# Patient Record
Sex: Male | Born: 1965 | Race: Black or African American | Hispanic: No | Marital: Single | Smoking: Current every day smoker
Health system: Southern US, Community
[De-identification: ages and names within clinical notes are randomized; demographics above are authoritative.]

## PROBLEM LIST (undated history)

## (undated) ENCOUNTER — Telehealth

## (undated) ENCOUNTER — Encounter

## (undated) ENCOUNTER — Encounter: Attending: Psychiatry

## (undated) ENCOUNTER — Telehealth: Attending: Psychiatry

## (undated) DIAGNOSIS — T7840XA Allergy, unspecified, initial encounter: Secondary | ICD-10-CM

## (undated) DIAGNOSIS — F17200 Nicotine dependence, unspecified, uncomplicated: Secondary | ICD-10-CM

## (undated) DIAGNOSIS — F329 Major depressive disorder, single episode, unspecified: Secondary | ICD-10-CM

## (undated) DIAGNOSIS — F32A Depression, unspecified: Secondary | ICD-10-CM

## (undated) DIAGNOSIS — F209 Schizophrenia, unspecified: Secondary | ICD-10-CM

## (undated) HISTORY — DX: Major depressive disorder, single episode, unspecified: F32.9

## (undated) HISTORY — DX: Schizophrenia, unspecified: F20.9

## (undated) HISTORY — DX: Depression, unspecified: F32.A

## (undated) HISTORY — DX: Nicotine dependence, unspecified, uncomplicated: F17.200

## (undated) HISTORY — DX: Allergy, unspecified, initial encounter: T78.40XA

---

## 2000-06-24 ENCOUNTER — Emergency Department (HOSPITAL_COMMUNITY): Admission: EM | Admit: 2000-06-24 | Discharge: 2000-06-24 | Payer: Self-pay

## 2000-12-30 ENCOUNTER — Inpatient Hospital Stay (HOSPITAL_COMMUNITY): Admission: EM | Admit: 2000-12-30 | Discharge: 2000-12-31 | Payer: Self-pay | Admitting: *Deleted

## 2000-12-31 ENCOUNTER — Encounter: Payer: Self-pay | Admitting: Surgery

## 2001-07-10 ENCOUNTER — Inpatient Hospital Stay (HOSPITAL_COMMUNITY): Admission: EM | Admit: 2001-07-10 | Discharge: 2001-07-20 | Payer: Self-pay | Admitting: Psychiatry

## 2001-08-04 ENCOUNTER — Encounter: Admission: RE | Admit: 2001-08-04 | Discharge: 2001-08-04 | Payer: Self-pay | Admitting: *Deleted

## 2001-08-09 ENCOUNTER — Encounter: Admission: RE | Admit: 2001-08-09 | Discharge: 2001-08-09 | Payer: Self-pay | Admitting: *Deleted

## 2001-08-09 ENCOUNTER — Inpatient Hospital Stay (HOSPITAL_COMMUNITY): Admission: EM | Admit: 2001-08-09 | Discharge: 2001-08-20 | Payer: Self-pay | Admitting: Psychiatry

## 2001-08-12 ENCOUNTER — Encounter: Payer: Self-pay | Admitting: Psychiatry

## 2001-09-05 ENCOUNTER — Encounter: Admission: RE | Admit: 2001-09-05 | Discharge: 2001-09-05 | Payer: Self-pay | Admitting: *Deleted

## 2001-09-08 ENCOUNTER — Inpatient Hospital Stay (HOSPITAL_COMMUNITY): Admission: EM | Admit: 2001-09-08 | Discharge: 2001-09-13 | Payer: Self-pay | Admitting: Psychiatry

## 2001-09-21 ENCOUNTER — Encounter: Admission: RE | Admit: 2001-09-21 | Discharge: 2001-09-21 | Payer: Self-pay | Admitting: *Deleted

## 2001-10-29 ENCOUNTER — Inpatient Hospital Stay (HOSPITAL_COMMUNITY): Admission: EM | Admit: 2001-10-29 | Discharge: 2001-10-31 | Payer: Self-pay | Admitting: Psychiatry

## 2001-11-28 ENCOUNTER — Inpatient Hospital Stay (HOSPITAL_COMMUNITY): Admission: EM | Admit: 2001-11-28 | Discharge: 2001-12-02 | Payer: Self-pay | Admitting: Psychiatry

## 2002-01-04 ENCOUNTER — Encounter: Admission: RE | Admit: 2002-01-04 | Discharge: 2002-01-04 | Payer: Self-pay | Admitting: *Deleted

## 2002-02-17 ENCOUNTER — Encounter: Admission: RE | Admit: 2002-02-17 | Discharge: 2002-02-17 | Payer: Self-pay | Admitting: *Deleted

## 2002-04-20 ENCOUNTER — Inpatient Hospital Stay (HOSPITAL_COMMUNITY): Admission: EM | Admit: 2002-04-20 | Discharge: 2002-04-22 | Payer: Self-pay | Admitting: Psychiatry

## 2002-04-21 ENCOUNTER — Encounter: Payer: Self-pay | Admitting: Psychiatry

## 2002-06-19 ENCOUNTER — Encounter: Admission: RE | Admit: 2002-06-19 | Discharge: 2002-06-19 | Payer: Self-pay | Admitting: Psychiatry

## 2002-08-14 ENCOUNTER — Encounter: Admission: RE | Admit: 2002-08-14 | Discharge: 2002-08-14 | Payer: Self-pay | Admitting: *Deleted

## 2002-09-25 ENCOUNTER — Encounter: Admission: RE | Admit: 2002-09-25 | Discharge: 2002-09-25 | Payer: Self-pay | Admitting: *Deleted

## 2002-11-20 ENCOUNTER — Encounter: Admission: RE | Admit: 2002-11-20 | Discharge: 2002-11-20 | Payer: Self-pay | Admitting: *Deleted

## 2002-12-21 ENCOUNTER — Encounter: Admission: RE | Admit: 2002-12-21 | Discharge: 2002-12-21 | Payer: Self-pay | Admitting: Psychiatry

## 2005-09-01 ENCOUNTER — Ambulatory Visit: Payer: Self-pay | Admitting: Internal Medicine

## 2006-03-11 ENCOUNTER — Ambulatory Visit: Payer: Self-pay | Admitting: Internal Medicine

## 2006-12-01 ENCOUNTER — Emergency Department (HOSPITAL_COMMUNITY): Admission: EM | Admit: 2006-12-01 | Discharge: 2006-12-01 | Payer: Self-pay | Admitting: Emergency Medicine

## 2006-12-06 ENCOUNTER — Ambulatory Visit (HOSPITAL_COMMUNITY): Admission: RE | Admit: 2006-12-06 | Discharge: 2006-12-07 | Payer: Self-pay | Admitting: Orthopedic Surgery

## 2007-07-05 ENCOUNTER — Other Ambulatory Visit: Payer: Self-pay | Admitting: Emergency Medicine

## 2007-07-05 ENCOUNTER — Other Ambulatory Visit: Payer: Self-pay

## 2007-07-05 ENCOUNTER — Ambulatory Visit: Payer: Self-pay | Admitting: Psychiatry

## 2007-07-05 ENCOUNTER — Inpatient Hospital Stay (HOSPITAL_COMMUNITY): Admission: EM | Admit: 2007-07-05 | Discharge: 2007-07-07 | Payer: Self-pay | Admitting: Psychiatry

## 2010-02-03 ENCOUNTER — Ambulatory Visit: Payer: Self-pay | Admitting: Internal Medicine

## 2010-02-03 DIAGNOSIS — F17200 Nicotine dependence, unspecified, uncomplicated: Secondary | ICD-10-CM | POA: Insufficient documentation

## 2010-02-03 DIAGNOSIS — J3089 Other allergic rhinitis: Secondary | ICD-10-CM | POA: Insufficient documentation

## 2010-02-07 ENCOUNTER — Encounter: Payer: Self-pay | Admitting: Internal Medicine

## 2010-06-12 NOTE — Miscellaneous (Signed)
Summary: med list update  Clinical Lists Changes  Medications: Added new medication of PROZAC 40 MG CAPS (FLUOXETINE HCL) Take 1 tablet by mouth once a day Added new medication of SEROQUEL 200 MG TABS (QUETIAPINE FUMARATE) 2tabs at bedtime Added new medication of TRAZODONE HCL 50 MG TABS (TRAZODONE HCL) Take 1 tab by mouth at bedtime Added new medication of * PROLIXIX 10MG  Take 1tab  every morning  and 2 tabs at bedtime

## 2010-06-12 NOTE — Assessment & Plan Note (Signed)
Summary: new / medicare /#/cd   Vital Signs:  Patient profile:   45 year old male Height:      66 inches Weight:      199.25 pounds BMI:     32.28 O2 Sat:      98 % on Room air Temp:     98.5 degrees F oral Pulse rate:   90 / minute Pulse rhythm:   regular Resp:     16 per minute BP sitting:   124 / 80  (left arm) Cuff size:   large  Vitals Entered By: Rock Nephew CMA (February 03, 2010 2:54 PM)  Nutrition Counseling: Patient's BMI is greater than 25 and therefore counseled on weight management options.  O2 Flow:  Room air  Primary Care Provider:  Etta Grandchild MD   History of Present Illness: new to me with a complaint of one year hx. of runny nose, nasal congestion, and sneezing.  Preventive Screening-Counseling & Management  Alcohol-Tobacco     Alcohol drinks/day: 0     Smoking Status: current     Smoking Cessation Counseling: yes     Smoke Cessation Stage: precontemplative     Packs/Day: 0.75     Year Started: 1983     Pack years: 20     Tobacco Counseling: to quit use of tobacco products  Caffeine-Diet-Exercise     Does Patient Exercise: no  Hep-HIV-STD-Contraception     Hepatitis Risk: no risk noted     HIV Risk: no risk noted     STD Risk: no risk noted      Sexual History:  not active.        Drug Use:  no.        Blood Transfusions:  no.    Medications Prior to Update: 1)  None  Current Medications (verified): 1)  Allegra Allergy 180 Mg Tabs (Fexofenadine Hcl) .... One By Mouth Once Daily As Needed For Allergies  Allergies (verified): No Known Drug Allergies  Past History:  Past Medical History: Schizophrenia  Past Surgical History: Denies surgical history  Family History: Family History Diabetes 1st degree relative Family History Hypertension Family History of Stroke M 1st degree relative <50  Social History: Single Current Smoker Alcohol use-no Drug use-no Regular exercise-no Smoking Status:  current Packs/Day:   0.75 Hepatitis Risk:  no risk noted HIV Risk:  no risk noted STD Risk:  no risk noted Sexual History:  not active Blood Transfusions:  no Drug Use:  no Does Patient Exercise:  no  Review of Systems  The patient denies anorexia, fever, weight loss, weight gain, chest pain, syncope, dyspnea on exertion, peripheral edema, prolonged cough, headaches, hemoptysis, abdominal pain, hematuria, suspicious skin lesions, enlarged lymph nodes, and angioedema.   ENT:  Complains of nasal congestion and postnasal drainage; denies decreased hearing, difficulty swallowing, ear discharge, hoarseness, nosebleeds, ringing in ears, sinus pressure, and sore throat.  Physical Exam  General:  alert, well-developed, well-nourished, well-hydrated, appropriate dress, cooperative to examination, poor hygiene, and unkempt.   Head:  normocephalic, atraumatic, no abnormalities observed, and no abnormalities palpated.   Eyes:  No corneal or conjunctival inflammation noted. EOMI. Perrla. Funduscopic exam benign, without hemorrhages, exudates or papilledema. Vision grossly normal. Ears:  R ear normal and L ear normal.   Nose:  no external deformity, no external erythema, no airflow obstruction, no intranasal foreign body, no nasal polyps, no nasal mucosal lesions, no mucosal friability, no active bleeding or clots, no sinus percussion tenderness, no  septum abnormalities, no nasal discharge, mucosal pallor, and mucosal edema.   Mouth:  Oral mucosa and oropharynx without lesions or exudates.  Teeth in good repair. Neck:  supple, full ROM, no masses, no thyromegaly, no thyroid nodules or tenderness, no JVD, normal carotid upstroke, no carotid bruits, no cervical lymphadenopathy, and no neck tenderness.   Lungs:  normal respiratory effort, no intercostal retractions, no accessory muscle use, normal breath sounds, no dullness, no fremitus, no crackles, and no wheezes.   Heart:  normal rate, regular rhythm, no murmur, no gallop, no  rub, and no JVD.   Abdomen:  soft, non-tender, normal bowel sounds, no distention, no masses, no guarding, no rigidity, no rebound tenderness, no abdominal hernia, no inguinal hernia, no hepatomegaly, and no splenomegaly.   Msk:  No deformity or scoliosis noted of thoracic or lumbar spine.   Pulses:  R and L carotid,radial,femoral,dorsalis pedis and posterior tibial pulses are full and equal bilaterally Extremities:  No clubbing, cyanosis, edema, or deformity noted with normal full range of motion of all joints.   Neurologic:  No cranial nerve deficits noted. Station and gait are normal. Plantar reflexes are down-going bilaterally. DTRs are symmetrical throughout. Sensory, motor and coordinative functions appear intact. Skin:  turgor normal, color normal, no rashes, no suspicious lesions, no ecchymoses, no petechiae, no purpura, no ulcerations, and no edema.   Cervical Nodes:  no anterior cervical adenopathy and no posterior cervical adenopathy.   Axillary Nodes:  no R axillary adenopathy and no L axillary adenopathy.   Inguinal Nodes:  no R inguinal adenopathy and no L inguinal adenopathy.   Psych:  Cognition and judgment appear intact. Alert and cooperative with normal attention span and concentration. 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   Orders: Tobacco use cessation intermediate 3-10 minutes (99406)  Complete Medication List: 1)  Allegra Allergy 180 Mg Tabs (Fexofenadine hcl) .... One by mouth once daily as needed for allergies  Patient Instructions: 1)  Please schedule a follow-up appointment in 1 month. 2)  Tobacco is  very bad for your health and your loved ones! You Should stop smoking!. 3)  Stop Smoking Tips: Choose a Quit date. Cut down before the Quit date. decide what you will do as a substitute when you feel the urge to smoke(gum,toothpick,exercise). Prescriptions: ALLEGRA ALLERGY 180 MG TABS (FEXOFENADINE HCL) One by mouth once daily as needed for allergies  #14 x 0   Entered and Authorized by:   Etta Grandchild MD   Signed by:   Etta Grandchild MD on 02/03/2010   Method used:   Samples Given   RxID:   [id number]

## 2010-09-23 NOTE — H&P (Signed)
NAME:  Garrett Guerrero, DENNIN NO.:  [id number]   MEDICAL RECORD NO.:  [id number]          PATIENT TYPE:  IPS   LOCATION:  0407                          FACILITY:  [facility]   PHYSICIAN:  Anselm Jungling, MD  DATE OF BIRTH:  [birth year]   DATE OF ADMISSION:  07/05/2007  DATE OF DISCHARGE:                       PSYCHIATRIC ADMISSION ASSESSMENT   A 45 year old male involuntary committed July 05, 2007.   HISTORY OF PRESENT ILLNESS:  The patient presents here on petition with  papers stating that he has a history of schizoaffective disorder.  Presented to the adult clinic feeling stressed for several days, hearing  voices to cut himself and to hurt others.  He is having increased  agitation and decreased sleep.  The patient does report that he has been  hearing voices for about 2-3 days telling him to kill himself or others  by himself, to cut his wrists.  His plan to hurt others would be to hit  them with a baseball bat.  He denies any specific stressors.  He does  report problems with sleep.  His appetite has been satisfactory.  He  reports compliance with his medication and denies any substance use.   PAST PSYCHIATRIC HISTORY:  The patient was here approximately 4 years  ago.  He is an outpatient at [facility].   SOCIAL HISTORY:  He is a 45 year old single male who lives with his  mother who is 28 years of age.  He has no children.  He is on  disability.   FAMILY HISTORY:  None.   ALCOHOL/DRUG HISTORY:  Denies any alcohol or drug use.   PRIMARY CARE Jaislyn Blinn:  Unknown.   MEDICAL PROBLEMS:  He denies any acute or chronic medical problems.   MEDICATIONS:  1. Seroquel 600 at bedtime.  2. Prolixin 10 mg in the morning and 20 mg at bedtime.  3. Prozac 40 mg daily.   DRUG ALLERGIES:  NO KNOWN ALLERGIES.   PHYSICAL EXAMINATION:  GENERAL:  This is a well-nourished male in no  acute distress.  He was assessed at [facility]  where he received Geodon IM.  VITAL SIGNS:  Temperature is 98.4, 84 heart rate, 20 respirations, blood  pressure 127/85.  He is 209 pounds.  He is 6 feet tall.   LABORATORY DATA:  His alcohol level was less than 5.  Urine drug screen  was negative.  CBC within normal limits.  Urinalysis was negative.  His  glucose 124.   MENTAL STATUS EXAM:  This a fully alert, cooperative male, good eye  contact.  He is casually dressed.  His speech is clear, normal pace and  tone.  The patient's mood is neutral.  The patient's affect is somewhat  flat, but pleasant, very polite.  Thought processes:  Endorsing auditory  hallucinations, although he does not appear to be actively responding at  this time and also endorsing suicidal or homicidal thoughts.  Promises  safety to himself and others, cognition intact.  His memory is good.  Judgment is fair.  Insight is fair.   AXIS I:  Schizoaffective disorder.  AXIS II:  Deferred.  AXIS III:  He has no known medical problems.  AXIS IV:  Other psychosocial problems related to his burden of illness.  AXIS V:  Current is 35.   PLAN:  Contract for safety.  Stabilize his mood and thinking.  We will  resume his medications.  Reinforce medication compliance.  Contact  family for any concerns.  Case manager will assess his return to prior  living arrangement.  The patient is to follow with mental health.  His  tentative length stay is 3-5 days.      Landry Corporal, N.P.      Anselm Jungling, MD  Electronically Signed    JO/MEDQ  D:  07/06/2007  T:  07/07/2007  Job:  [id number]

## 2010-09-23 NOTE — Op Note (Signed)
NAMEDORWIN, FITZHENRY                ACCOUNT NO.:  [id number]   MEDICAL RECORD NO.:  [id number]          PATIENT TYPE:  OIB   LOCATION:  5025                         FACILITY:  [facility]   PHYSICIAN:  Madelynn Done, MD  DATE OF BIRTH:  [birth year]   DATE OF PROCEDURE:  12/06/2006  DATE OF DISCHARGE:                               OPERATIVE REPORT   PREOPERATIVE DIAGNOSIS:  1. Right displaced intra-articular distal radius fracture and ulnar      styloid fracture.  2. Tobacco use.  3. Schizophrenia.   POSTOPERATIVE DIAGNOSES:  1. Right displaced intra-articular distal radius fracture and ulnar      styloid fracture.  2. Tobacco use.  3. Schizophrenia.   ATTENDING SURGEON:  Madelynn Done, MD, who was scrubbed and present  for the entire procedure.   ASSISTANT SURGEON:  None.   PROCEDURES:  1. Open treatment of right distal radius intra-articular fracture,      with fixation of three or more fragments  2. Radiographs 3-views right wrist.  3. Stress radiography.   SURGICAL IMPLANTS:  1. [employer] standard distal radius volar locking plate, with 4      smooth pegs distally and 2 partially threaded pegs distally --      total of 6 pegs.  Three 3.5 mm bicortical screws proximally.  2. VITOSS 5 mL bone substitute.   SURGICAL INDICATIONS:  Mr. Woodcox is a 45 year old right-hand-dominant  gentleman who sustained a displaced intra-articular distal radius and  ulna fracture on December 01, 2006.  He fell from a tree.  The patient  presented to my office for follow-up evaluation.  His legal guardian was  present with him.  After a counseling session was carried out after  going through the risks and benefits and alternatives of treatment, the  patient's family agreed to proceed with the above procedure.  Risks  include, but not limited to, bleeding, infection, nerve damage, tendon  injury, artery injury, nonunion, malunion, hardware failure, loss of  motion of the wrist and  digits, and forearm rotation, need for further  surgery and dystrophy of the hand.   A signed informed consent was obtained on the day of surgery.   INTRAOPERATIVE FINDINGS:  The patient did have a comminuted volar  segment.  There was more comminution along the radial column.  After  fixation of the distal radius, the distal radial ulnar joint was  assessed in neutral pronation and supination.  There did not appear to  be any gross instability in all 3 positions, but there appeared to be  more stability with him in supination.  Therefore he was splinted in  supination following the procedure.   DESCRIPTION OF PROCEDURE:  The patient was properly identified in the  preoperative holding area, and a permanent marker was made on the right  wrist indicating correct operative site.  The patient received  preoperative antibiotics prior to any skin incision.  The patient then  brought back to the operating room, placed supine on the anesthesia room  table -- where general anesthesia was administered via LMA.  The patient  tolerated this well.  All pressure points were well-padded.  A well-  padded tourniquet was then placed on the right brachium and sealed with  a 1000 drape.  The right upper extremity was then closed with Betadine  and then prepped with Hibiclens and sterilely draped.  A time-out was  called.  The correct site was identified and the surgical procedure was  then begun.  The right upper extremity skin incision was then marked  directly over the FCR tendon sheath.  It was not carried across the  wrist crease.  The limb was then elevated using Esmarch exsanguination;  the tourniquet insufflated to 250 mmHg.  Using the finger traps and 10  pounds weight,  distraction was then carried out over the end of the  table.  The dissection was carried down through the skin and  subcutaneous tissues.  The standard FCR approach was then used.  Dissection was carried out through the FCR  tendon sheath, and was  released proximally and distally.  Going through the floor of FCR tendon  sheath, the FPL tendon was then identified and retracted ulnarly.  The  pronator of the quadratus was then identified, and an L-shaped capsular  flap was then raised off the pronator from what portion remained.  There  was a moderate amount of the pronator entrapped within the fracture  site.  Portions of the brachial radialis were also then released  distally.  There was a large bone loss within the distal metaphyseal  region and radial side.  After adequate exposure was obtained using the  cortical window using the cortical wounds for windows from the fracture  sites, the 5 mL of bone substitute was then placed within the defect.  This allowed for good stability along the radial column and the dorsal  comminution.  After placement of the allograft, an open reduction was  then performed.  The [employer] distal radius plate was then  applied, by using the oblong hole; a 3.5 cortical screw was then placed  and the position was confirmed using the mini C-arm.  It was felt to be  in good position.   Attention was then turned distally, where the 2 distal locking threaded  pegs were then placed along the lunate corner.  These were appropriately  drilled and measured; did obtain purchase along the dorsal cortical  region of the lunate facet region dorsally.  After placement of the 2  screws, their positions were then confirmed using the C-arm.  Four more  additional locking pegs were then placed.  After the distal fixation was  then obtained and there was proximal fixation, the traction mechanism  was then released.  Then 2 more 3.5 mm bicortical proximal screws were  then placed.   After placement of all the internal fixation, final mini C-arm images  were then obtained -- which did show good reduction and restoration of  the radial height inclination and volar tilt.  There was still a  dorsal  comminution and comminution along the radial column.  Under live  fluoroscopy and stress imaging there did not appear to be any  penetration of the implants within the articular surface; and felt to be  in the subchondral bone region of the distal radius.   After final images were obtained, the wrist was placed through a range  of motion, flexion and extension as well as forearm rotation.  The  distal ulna was then also assessed; there did not appear  to be any  instability or significant displacement of the ulnar styloid and ulnar  head fracture.  I did not feel it  was necessary for it to undergo open  reduction internal fixation.  The wound was then thoroughly irrigated.  The pronator quadratus flap was then closed with 2-0 Vicryl suture.  The  tourniquet was then deflated and hemostasis was obtained with just  direct pressure.  There was not any significant arterial or venous  bleeders.  A small TLS #7 drain was then applied in the deep layer, and  the subcutaneous tissues were closed with 4-0 Monocryl suture.  The skin  was then closed with a running 4-0 nylon horizontal mattress suture, as  well several simple 4-0 nylon sutures.  Then 20 mL of 0.25% Marcaine  were then injected around the field for local anesthesia.  Adaptic was  then applied to the wound, and then a sterile compressive dressing was  applied.  The patient was then placed in a well molded sugar-tong splint  in slight forearm supination.  Again, images were obtained and the  distal radioulnar joint was assessed; there did not appear to be any  significant instability.  It was felt slightly more stable in  supination.  After placement of the splint, the patient was then  extubated and taken to the recovery room in good condition.   INTRAOPERATIVE RADIOGRAPHS:  Three views of the right wrist, and under  live fluoroscopy and stress imaging, these do show restoration of the  radial height and inclination.  There  is the dorsal comminution noted in  slight displacement on the lateral of the dorsal cortex.  The implants  of the pegs and screws appear to be appropriate length.   POSTOPERATIVE PLAN:  The patient will be admitted overnight for IV  antibiotics and pain control.  He will be discharged in the morning if  his pain is controlled.  I will plan to see him back in about 10-14 days  for a splint check, splint change and suture removal.  He will continue  with a long-arm immobilization for 3 weeks.  We will x-ray him at the  first visit and the 3-week mark, then he will come back; then  transitioning image of the splint to allow for some elbow flexion and  extension -- with a total of 6 weeks immobilization.   ANESTHESIA:  General via LMA.   DRAINS:  One #7 TLS drain.      Madelynn Done, MD  Electronically Signed     FWO/MEDQ  D:  12/06/2006  T:  12/06/2006  Job:  [id number]

## 2010-09-23 NOTE — Discharge Summary (Signed)
NAMEJESTIN, BURBACH                ACCOUNT NO.:  [id number]   MEDICAL RECORD NO.:  [id number]          PATIENT TYPE:  OIB   LOCATION:  5025                         FACILITY:  [facility]   PHYSICIAN:  Madelynn Done, MD  DATE OF BIRTH:  11-08-1965   DATE OF ADMISSION:  12/06/2006  DATE OF DISCHARGE:  12/07/2006                               DISCHARGE SUMMARY   ADMITTING DIAGNOSES:  1. Right distal radius and distal ulna fracture.  2. Schizophrenia.   DISCHARGE DIAGNOSES:  1. Right distal radius and distal ulna fracture.  2. Schizophrenia.   PROCEDURES:  Open reduction internal fixation of right distal radius on  December 06, 2006.   HOSPITAL COURSE:  The patient was admitted following the above  procedure.  He tolerated the anesthesia well.  His pain was controlled  on oral pain medications.  He was seen on hospital day number 1.  He was  afebrile.  His vital signs were stable.  He was tolerating a regular  diet.  Patient is ready to be discharged to home.  He was ambulating to  the bathroom.   He was seen and examined.  His splint was clean and drain intact.  His  drain was removed.   DISPOSITION:  To home with his sister.  Patient lives with his mother.   FOLLOWUP:  He is going to be following up in the office in 10 days.  During this he needs to keep his splint clean and dry and do not remove.  He needs to return to clinic sooner if he has worsening fevers, chills,  nausea, vomiting, pain or problems with his splint.      Madelynn Done, MD  Electronically Signed     FWO/MEDQ  D:  12/07/2006  T:  12/07/2006  Job:  [id number]

## 2010-09-26 NOTE — Discharge Summary (Signed)
[facility]  Patient:    Garrett Guerrero, Garrett Guerrero Visit Number: [id number] MRN: [id number]          Service Type: PSY Location: 400 0400 02 Attending Physician:  Jeanice Lim Dictated by:   Jeanice Lim, M.D. Admit Date:  09/08/2001 Discharge Date: 09/13/2001                             Discharge Summary  IDENTIFYING DATA:  This is a 45 year old single African-American male voluntarily admitted for psychosis, reporting auditory hallucinations telling him to hurt himself.  PAST PSYCHIATRIC HISTORY:  The patient has had multiple admissions to [facility] over a short period of time.  Seen now by Dr. Lourdes Sledge in the outpatient clinic.  MEDICATIONS:  Seroquel, Risperdal, Lexapro and Dilantin.  ALLERGIES:  No known drug allergies.  PHYSICAL EXAMINATION:  Essentially within normal limits.  Neurologically nonfocal.  LABORATORY DATA:  Routine admission labs were not indicated due to multiple recent admissions.  MENTAL STATUS EXAMINATION:  Alert, young adult, African-American male casually dressed, cooperative.  Speech within normal limits.  Mood depressed.  Affect flat.  Thought processes goal directed.  Thought content reporting auditory and visual hallucinations as well as suicidal ideation due to command hallucinations, seeing green men.  Question of atypical psychotic symptoms and lack of distress with concern of possible malingering or manipulative report of symptoms.  Cognitively intact.  Judgment and insight limited.  ADMISSION DIAGNOSES: Axis I:    Schizoaffective disorder, rule out. Axis II:   Personality disorder not otherwise specified. Axis III:  None. Axis IV:   Moderate (problems with primary support). Axis V:    30/65.  HOSPITAL COURSE:  The patient was admitted and ordered routine p.r.n. medications and continued on psychotropics.  The patient reported that he felt he was losing it, voices are getting worse, telling him to kill himself  at the time of admission.  After two days in the hospital and optimization of Lexapro and Risperdal, patient reported feeling angry at staff, possibly paranoid, reporting a decrease in auditory hallucinations and resolution of suicidal thoughts, reporting no dangerous ideation.  The patient tolerated medication changes without side effects.  CONDITION ON DISCHARGE:  Improved.  Mood was less dysphoric and distressed. Affect brighter.  Thought process goal directed.  Thought content negative for dangerous ideation.  The patient reported resolution of psychotic symptoms, no longer feeling paranoid nor experiencing hallucinations.  Also denied dangerous ideation, feeling safe and hopeful about outpatient treatment.  DISCHARGE MEDICATIONS: 1. Seroquel 200 mg q.i.d. 2. Valium 10 mg t.i.d. 3. Risperdal 0.5 mg b.i.d. and 2 q.h.s. 4. Lexapro 10 mg q.a.m.  FOLLOW-UP:  Dr. Lourdes Sledge on Wednesday, Sep 21, 2001 at 2 p.m.  DISCHARGE DIAGNOSES: Axis I:    Schizoaffective disorder, rule out. Axis II:   Personality disorder not otherwise specified. Axis III:  None. Axis IV:   Moderate (problems with primary support). Axis V:    Global Assessment of Functioning on discharge 55. Dictated by:   Jeanice Lim, M.D. Attending Physician:  Jeanice Lim DD:  10/19/01 TD:  10/23/01 Job: [id number]

## 2010-09-26 NOTE — Discharge Summary (Signed)
[place]  Patient:    Garrett, Guerrero Visit Number: [id number] MRN: [id number]          Service Type: MED Location: [phone] Attending Physician:  Trauma, Md Dictated by:   Shawn Rayburn, P.A. Admit Date:  12/30/2000 Discharge Date: 12/31/2000                             Discharge Summary  DISCHARGE DIAGNOSES: 1. Status post fall. 2. Left-sided rib fractures with small pleural effusion. 3. Grade 1 spleen laceration.  ADMITTING TRAUMA SURGEON:  Dr. Magnus Ivan.  CONSULTANTS:  None.  PROCEDURES:  None.  HISTORY OF PRESENT ILLNESS:  This is a 45 year old male who fell down approximately 4-5 steps landing on his back and left side about 5 days prior to his presentation to the emergency room on December 30, 2000.  He reports that he developed progressive pain in the lateral chest and flank as well as difficulty breathing.  On exam he was tender about the left lateral rib border and was febrile with a temperature of 100.0.  He was mildly tachycardic with a pulse of 104, respirations of 20, but oxygen saturations adequate at 99% on room air.  Blood pressures was 163/90.  The patient is admitted for overnight observation and for pain management. The patient was placed on Tylox and Toradol on a scheduled basis.  By the following a.m. his symptoms were improved and he reported adequate pain relief.  He was discharged home in stable and improved condition on December 31, 2000.  He was to follow up in the trauma service next week.  DISCHARGE MEDICATIONS: 1. Lopressor 50 mg p.o. q.d. 2. Ativan 1 mg p.o. b.i.d. 3. Tylox 1-2 p.o. q.4-6h. p.r.n. pain #40 no refill. Dictated by:   Shawn Rayburn, P.A. Attending Physician:  Trauma, Md DD:  01/25/01 TD:  01/25/01 Job: [id number]

## 2010-09-26 NOTE — Discharge Summary (Signed)
[facility]  Patient:    DANE, KOPKE Visit Number: [id number] MRN: [id number]          Service Type: PSY Location: 400 0400 02 Attending Physician:  Jeanice Lim Dictated by:   Jeanice Lim, M.D. Admit Date:  09/08/2001 Discharge Date: 09/13/2001                             Discharge Summary  IDENTIFYING DATA:  This is a 45 year old single African-American male voluntarily admitted for psychosis, presenting with a history of psychosis. Recently admitted.  Complaining of visual hallucinations, seeing little green men, continuing to be depressed, reporting decreased sleep, nightmares, and extreme panic.  Previously hospitalized two weeks earlier.  The patient reported compliance with medications and outpatient appointments.  ADMISSION MEDICATIONS: 1. Valium. 2. Zyprexa. 3. Lexapro. 4. Risperdal.  ALLERGIES:  No known drug allergies.  PHYSICAL EXAMINATION:  GENERAL:  Performed at [facility]; within normal limits.  NEUROLOGIC:  Nonfocal.  ROUTINE ADMISSION LABORATORY DATA:  Within normal limits.  MENTAL STATUS EXAMINATION:  Alert, young middle-aged African-American male, casually dressed.  Speech: Within normal limits.  Mood: Depressed.  Affect: Flat.  Thought process: Goal directed.  Thought content: Reporting positive visual and auditory hallucinations, no dangerous ideation except for fleeting suicidal ideation, contracting for safety.  Cognitive: Intact.  Judgment and insight: Limited.  ADMITTING DIAGNOSES: Axis I:    1. Psychosis, not otherwise specified.            2. Rule out schizoaffective disorder, depressed type. Axis II:   None. Axis III:  Hypertension. Axis IV:   Moderate problems with primary support group. Axis V:    25/60  HOSPITAL COURSE:  The patient was admitted and ordered routine p.r.n. medications, was restarted on Valium, Zyprexa, Lexapro, and Risperdal. Zyprexa was discontinued due to lack of  efficacy and Seroquel optimized as well as Risperdal optimized.  Head CT was ordered to rule out any neurologic abnormality and Seroquel was further optimized.  Risperdal tapered due to mild side effects.  Lexapro was optimized, targeting depressive symptoms.  The patient was started on hydrochlorothiazide for elevated blood pressure and Seroquel was optimized at 800 mg q.h.s.  The patient tolerated medication changes well and reported improvement in symptoms.  CONDITION AT DISCHARGE:  Mood: More euthymic.  Affect: Less restricted. Thought process: Goal directed.  Thought content: Negative for acute dangerous ideation or overt psychotic symptoms at the time of discharge.  DISCHARGE MEDICATIONS: 1. Seroquel 200 mg four q.h.s. 2. Valium 10 mg q.i.d. 3. Risperdal 0.5 mg q.a.m., 3 p.m., and two q.h.s. 4. Lexapro 10 mg three q.a.m. 5. Cogentin 0.5 mg q.a.m., 3 p.m., and q.h.s. 6. Hydrochlorothiazide 12.5 mg q.a.m.  FOLLOWUP:  Dr. Lourdes Sledge.  DISCHARGE DIAGNOSES: Axis I:    1. Psychosis, not otherwise specified.            2. Rule out schizoaffective disorder, depressed type. Axis II:   None. Axis III:  Hypertension. Axis IV:   Moderate problems with primary support group. Axis V:    Global assessment of functioning on discharge was 50. Dictated by:   Jeanice Lim, M.D. Attending Physician:  Jeanice Lim DD:  09/21/01 TD:  09/23/01 Job: [id number]

## 2010-09-26 NOTE — H&P (Signed)
[facility]  Patient:    Garrett Guerrero, Garrett Guerrero Visit Number: [id number] MRN: [id number]          Service Type: PSY Location: 400 0405 01 Attending Physician:  Rachael Fee Dictated by:   Candi Leash. Orsini, N.P. Admit Date:  07/10/2001 Discharge Date: 07/20/2001                     Psychiatric Admission Assessment  IDENTIFYING INFORMATION:  A 45 year old single African-American male, involuntarily admitted for psychosis on July 10, 2001.  HISTORY OF PRESENT ILLNESS:  The patient presents with a history of psychosis, seeing "little green men."  He is afraid they are going to hurt him.  He was hiding in the closet crying.  When EMS came to get him, he thought that they were "demons," and that they were there to kill him.  The patient was combative in the emergency room.  He reports that he is stressed over his work.  He was working 12-hour days.  He needs to be home to care for his mother, but states that his boss is not concerned.  His sleeping has been decreased.  He is having a fair appetite.  Reports positive paranoid ideation. He denies any suicidal or homicidal ideation or any current psychosis.  He states that he is doing some drinking on the weekend and reports no abusive of any medication.  PAST PSYCHIATRIC HISTORY:  First hospitalization at [facility]. He has no other hospitalizations, no outpatient treatment, no suicide attempts.  SOCIAL HISTORY:  A 45 year old single African-American male with no children. He lives with his mother.  He works at a [employer].  No legal problems. He has completed the 8th grade.  FAMILY HISTORY:  None.  ALCOHOL DRUG HISTORY:  The patient smokes, has been drinking 2 quarts of beer on the week, last drink on July 10, 2001.  No history of blackouts or seizures.  Denies any substance abuse.  PAST MEDICAL HISTORY:  Primary care Thurmond Hildebran is Dr. Donia Guiles in [place].  Medical problems at  hypertension.  Medications are Valium t.i.d. prescribed by Dr. Shana Chute.  Has been on that for 3-4 years.  DRUG ALLERGIES:  No known allergies.  PHYSICAL EXAMINATION:  Performed at [facility] Emergency Department.  CBC was within normal limits.  CMET was 133, SGOT was 41, urine drug screen was positive for benzos.  Alcohol level was 59.  Urinalysis was within normal limits.  MENTAL STATUS EXAMINATION:  He is an alert, young middle-aged, well-nourished African-American male, cooperative, fair eye contact.  Speech was soft spoken and relevant.  Mood is depressed, affect is flat.  Thought process positive paranoia, no auditory or visual hallucinations, no suicidal or homicidal ideation.  His thought processes otherwise are logical and coherent. Cognitive function:  He is unsure of his age, memory is fair, judgment is fair, insight is fair.  He appears to have limited intellectual functioning.  ADMISSION DIAGNOSES: Axis I:    1. Psychosis not otherwise specified.            2. Rule out major depression with psychotic features.            3. Rule out schizophrenia. Axis II:   Deferred. Axis III:  Hypertension. Axis IV:   Problems with primary support group, occupation. Axis V:    Current 25, estimated this past year 65-70.  INITIAL PLAN OF CARE:  Involuntary admission for psychosis.  Contract for safety.  Patient  placed on 400 hall for close monitoring.  Will initiate Zyprexa for hallucinations, will obtain labs.  Librium will be available for withdrawal symptoms.  Will contact patients pharmacy for blood pressure medication.  Our goal is to stabilize mood and thinking so patient can be safe, to be medication compliant, to follow up with mental health.  TENTATIVE LENGTH OF STAY:  3 to 5 days. Dictated by:   Candi Leash. Orsini, N.P. Attending Physician:  Rachael Fee DD:  08/10/01 TD:  08/10/01 Job: [id number]

## 2010-09-26 NOTE — Discharge Summary (Signed)
NAME:  Garrett Guerrero, Garrett Guerrero                          ACCOUNT NO.:  [id number]   MEDICAL RECORD NO.:  [id number]                   PATIENT TYPE:  IPS   LOCATION:  0400                                 [facility]ILITY[facility]   PHYSICIAN:  Jeanice Lim, MD                DATE OF BIRTH:  17-Feb-1966   DATE OF ADMISSION:  11/28/2001  DATE OF DISCHARGE:  12/02/2001                                 DISCHARGE SUMMARY   IDENTIFYING DATA:  This is a 45 year old African-American male with a  history of schizoaffective disorder became anxious over the last several  weeks due to changes in psychiatric appointments and complications in short-  term disability.  The patient reported having suicidal thoughts when he came  to the hospital as well as auditory hallucinations, although he appeared  somewhat unreliable.   MEDICATIONS:  Lexapro 10 mg q.a.m., Valium 10 mg q.i.d., Seroquel and  Risperdal.   ALLERGIES:  No known drug allergies.   PHYSICAL EXAMINATION:  Essentially within normal limits.  Neurologically  nonfocal.   LABORATORY DATA:  Routine admission labs essentially within normal limits  with glucose mildly elevated at 126.   MENTAL STATUS EXAM:  Large-built male muscular, blunted affect with  psychomotor slowing.  Polite and cooperative.  Mood depressed and mildly  anxious.  Thought processes goal directed.  Thought content negative for  suicidal or homicidal ideation.  The patient reported auditory  hallucinations and was preoccupied with disability, paperwork and concerned  about plans were stressors.  Cognition intact.  Judgment and insight  limited.   ADMISSION DIAGNOSES:   AXIS I:  1. Schizoaffective disorder, depressed.  2. Alcohol abuse in partial remission.   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   AXIS III:  Hypertension.   AXIS IV:  Moderate (problems with financial stress).   AXIS V:  35/62.   HOSPITAL COURSE:  The patient was admitted and ordered routine  p.r.n.  medications.  Was resumed on his psychotropics in addition to  hydrochlorothiazide to manage blood pressure.  Lexapro and Seroquel were  adjusted.  The patient reported a positive response to medication changes  and had great relief when paperwork was completed.   CONDITION ON DISCHARGE:  Improved.  Mood was more euthymic.  Affect  brighter.  Thought processes goal directed.  Thought content negative for  dangerous ideation or psychotic symptoms.  The patient reported motivation  to be compliant with the follow-up plan.   DISCHARGE MEDICATIONS:  1. Seroquel 200 mg t.i.d. and 2 q.h.s.  2. Risperdal 0.5 mg q.a.m., 3 p.m. and 2 q.h.s.  3. Valium 10 mg q.i.d.  4. Cogentin 0.5 mg t.i.d.  5. Hydrochlorothiazide 25 mg, 1/2 q.a.m.  6. Lexapro 10 mg q.a.m.   FOLLOW UP:  Dr. Hipolito Bayley, outpatient clinic, December 30, 2001 at 1  p.m.  Jeanice Lim, MD    JEM/MEDQ  D:  01/04/2002  T:  01/06/2002  Job:  [id number]

## 2010-09-26 NOTE — H&P (Signed)
[facility]  Patient:    Garrett Guerrero, Garrett Guerrero Visit Number: [id number] MRN: [id number]          Service Type: PSY Location: 400 0405 01 Attending Physician:  Rachael Fee Dictated by:   Candi Leash. Orsini, N.P. Admit Date:  07/10/2001 Discharge Date: 07/20/2001                     Psychiatric Admission Assessment  IDENTIFYING INFORMATION:  This is a 45 year old single African-American male voluntarily admitted for psychosis on August 09, 2001.  HISTORY OF PRESENT ILLNESS:  The patient presents with a history of psychosis, recently admitted for visual hallucinations, seeing "little green men."  He continued with the depression and psychotic symptoms.  He reports decreased sleep, experiencing nightmares of killing himself.  The patient feels very nervous upon awakening and having agoraphobia.  His appetite has been fair. He denies any specific stressors but does state that he needs short-term disability.  He has been out of work for the past three weeks.  He reports he has been compliant with his medication and is just stressed that he is not better.  PAST PSYCHIATRIC HISTORY:  Last hospitalization was two weeks ago.  This is his second admission to [facility] with no prior suicide attempts.  He was in the IOP program with Dr. Lourdes Sledge.  SOCIAL HISTORY:  He is a 45 year old single African-American male.  He lives with his mother.  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  He is on short-term disability.  He has completed the eighth grade.  FAMILY HISTORY:  None.  ALCOHOL/DRUG HISTORY:  The patient smokes.  He denies any alcohol or substance abuse.  PRIMARY CARE Remigio Mcmillon:  Dr. Donia Guiles in [place].  MEDICAL PROBLEMS:  Hypertension.  MEDICATIONS:  The patient has been on Valium 10 mg q.i.d., Zyprexa 10 mg q.h.s., Lexapro 20 mg q.a.m., Risperdal 0.5 mg t.i.d., 1 mg q.h.s.  DRUG ALLERGIES:  No known allergies.  PHYSICAL  EXAMINATION:  Performed at last admission.  The patient presents as a well-nourished, well-developed, African-American male.  LABORATORY DATA:  CBC within normal limits.  CMET within normal limits.  UA is negative.  MENTAL STATUS EXAMINATION:  He is an alert, young, middle-aged African-American male.  He is casually dressed with fair eye contact.  He is cooperative.  Speech is normal and relevant.  Mood is depressed.  Affect is flat.  Thought processes are positive visual hallucinations, positive auditory hallucinations.  No suicidal or homicidal ideation.  No paranoid ideation. Cognitive function intact.  Memory is fair.  Judgment and insight is fair.  DIAGNOSES: Axis I:    1. Psychosis not otherwise specified.            2. Rule out major depression with psychotic features. Axis II:   Deferred. Axis III:  Hypertension. Axis IV:   Deferred. Axis V:    Current 25; estimated this past year 60.  PLAN:  Voluntary admission to [facility] for psychosis. Contract for safety.  Check every 15 minutes.  The patient to be placed on the 400 Hall for close monitoring.  Will obtain labs.  Resume his medication. Treatment was discussed with Dr. Kathrynn Running.  Will stabilize his mood and thinking so patient can be safe.  We will decrease his Zyprexa, increase his Seroquel.  TENTATIVE LENGTH OF STAY:  Three to five days. Dictated by:   Candi Leash. Orsini, N.P. Attending Physician:  Rachael Fee DD:  08/11/01 TD:  08/11/01 Job: [id number]

## 2010-09-26 NOTE — Discharge Summary (Signed)
[facility]  Patient:    Garrett Guerrero, Garrett Guerrero Visit Number: [id number] MRN: [id number]          Service Type: PSY Location: 400 0401 02 Attending Physician:  Jeanice Lim Dictated by:   Jeanice Lim, M.D. Admit Date:  08/09/2001 Discharge Date: 08/20/2001                             Discharge Summary  IDENTIFYING DATA:  This is a 45 year old single African-American male voluntarily admitted for psychosis reporting to see little green men, afraid that they are going to hurt him, hearing demons telling him to hurt himself and others.  Also described paranoid ideation.  MEDICATIONS:  Valium t.i.d. prescribed by Dr. Shana Chute.  ALLERGIES:  No known drug allergies.  PHYSICAL EXAMINATION:  Essentially within normal limits.  Neurologically nonfocal.  LABORATORY DATA:  Routine admission labs essentially within normal limits. Urine drug screen positive for benzodiazepines.  Alcohol level 59.  MENTAL STATUS EXAMINATION:  Middle-aged African-American male, cooperative, maintaining poor eye contact.  Speech soft.  Mood depressed.  Affect blunted. Thought process goal directed.  Thought content positive for auditory and visual hallucinations, paranoid ideation, reporting seeing little green men, like figurines running around during the day and hearing voices telling him to hurt himself and to hurt others, which he is able to resist at this time. Cognitively intact.  Judgment and insight limited.  ADMISSION DIAGNOSES: Axis I:    Psychosis not otherwise specified versus schizoaffective disorder,            depressed-type. Axis II:   None. Axis III:  Hypertension. Axis IV:   Moderate (problems with primary support). Axis V:    25/65.  HOSPITAL COURSE:  The patient was admitted and ordered routine p.r.n. medications.  Continued to report episodic hallucinations.  Was optimized on Zyprexa, Valium and trazodone to restore sleep.  His depressive symptoms  were targeted as well as psychotic symptoms.  The patient tolerated medication changes well with a very limited response, which was slow.  The patient reported significant decrease in auditory hallucinations and no suicidal ideation and felt that he was able to cope with the episodic voices and be safe as an outpatient and compliant with medications.  CONDITION ON DISCHARGE:  Improved.  Mood was less depressed.  Affect somewhat brighter, less guarded, less paranoid.  Decrease in auditory and visual hallucinations.  Judgment and insight slightly improved.  DISCHARGE MEDICATIONS: 1. Loxapine 25 mg q.a.m. and 3 p.m. and q.h.s. 2. Trazodone 150 mg q.h.s. 3. Zyprexa 20 mg q.h.s. 4. Valium 10 mg q.i.d. 5. Lexapro 20 mg q.a.m.  FOLLOW-UP:  The patient was advised not to drive sedated and to follow up with Dr. Lourdes Sledge on August 04, 2001 at 11 a.m.  DISCHARGE DIAGNOSES: Axis I:    Psychosis not otherwise specified versus schizoaffective disorder,            depressed-type. Axis II:   None. Axis III:  Hypertension. Axis IV:   Moderate (problems with primary support). Axis V:    Global Assessment of Functioning on discharge 45-50. Dictated by:   Jeanice Lim, M.D. Attending Physician:  Jeanice Lim DD:  09/07/01 TD:  09/07/01 Job: [id number]

## 2010-09-26 NOTE — Discharge Summary (Signed)
NAME:  Garrett Guerrero, Garrett Guerrero                          ACCOUNT NO.:  [id number]   MEDICAL RECORD NO.:  [id number]                   PATIENT TYPE:  IPS   LOCATION:  0407                                 [facility]ACILITY:  [facility]   PHYSICIAN:  Geoffery Lyons, M.D.                   DATE OF BIRTH:  21-Dec-1965   DATE OF ADMISSION:  04/20/2002  DATE OF DISCHARGE:  04/22/2002                                 DISCHARGE SUMMARY   CHIEF COMPLAINT AND PRESENT ILLNESS:  This was one of multiple admissions to  [facility] for this 45 year old African-American male,  single.  History of schizoaffective disorder.  Reports poor sleep for three  weeks, saying that he had just not been able to sleep through the night.  Falls asleep at 8 p.m. and wakes at midnight and stays awake all night.  Became panicky when he was riding in the car.  Heard some voices and arrived  to the psychiatric's office where there was some mix-up in the appointment.  He felt very overwhelmed.  He felt that his world was caving in.  He could  not contract for safety.  He was afraid he could cut his wrist or possibly  harm someone, so inpatient treatment was recommended.   PAST PSYCHIATRIC HISTORY:  Dr. Lourdes Sledge at [facility].   ALCOHOL/DRUG HISTORY:  Denies the use or abuse of any substances.   PAST MEDICAL HISTORY:  Hypertension.   MEDICATIONS:  Hydrochlorothiazide 25 mg daily, Valium 10 mg four times a  day, Lexapro 10 mg daily, Cogentin 0.5 mg three times a day, Seroquel 200 mg  twice a day and 600 mg at night, Risperdal 0.5 mg every morning and 3 p.m.  and 1 mg at night.   PHYSICAL EXAMINATION:  Performed and failed to show any acute findings.   MENTAL STATUS EXAM:  Upon admission reveals a tall, large-built male,  overweight.  Blunted affect.  Appears anxious.  Cooperative.  Initially  fearful.  Preoccupied with thoughts they are going to send him away.  Mood  is anxious.  Affect is  broad.  Thoughts deal with the events, the  uncertainty.  Denies and there is no evidence of auditory hallucinations.  Cognition well-preserved.   ADMISSION DIAGNOSES:   AXIS I:  Schizoaffective disorder.   AXIS II:  No diagnosis.   AXIS III:  Hypertension.   AXIS IV:  Moderate.   AXIS V:  Global Assessment of Functioning upon admission 36; highest Global  Assessment of Functioning in the last year 62.   LABORATORY DATA:  CBC was within normal limits.  Blood chemistries with  alkaline phosphatase 132.  Drug screen was negative for substances of abuse.   HOSPITAL COURSE:  He was basically kept on his Valium 10 mg four times a  day, Cogentin 0.5 mg three times a day, Lexapro 10 mg  in the morning,  Seroquel 200 mg twice a day and 600 mg at night, trazodone 50 mg at night,  Risperdal 0.5 mg in the morning and 3 p.m. and 1 mg at night.  As he settled  into the unit and participated in individual, group and milieu therapy and  he was back on medications.  He felt that he just needed some medication  adjustment.  Denied any auditory or visual hallucinations.  Did endorse some  auditory hallucinations but they did not tell him to hurt himself.  Said  that he had to accept that these symptoms are going to be a part of his life  and he had to accept it.  He denied any suicidal ideation, denied any  homicidal ideation, had worked on Pharmacologist, Optician, dispensing.  Was  wanting to be discharged and pursue outpatient follow-up.   DISCHARGE DIAGNOSES:   AXIS I:  Schizoaffective disorder.   AXIS II:  No diagnosis.   AXIS III:  Hypertension by history.   AXIS IV:  Moderate.   AXIS V:  Global Assessment of Functioning upon discharge 50-55.   DISCHARGE MEDICATIONS:  1. Valium 10 mg four times a day.  2. Lexapro 10 mg daily.  3. Seroquel 200 mg twice a day and 600 mg at night.  4. Risperdal 0.5 mg, 1 in the morning, 1 mg at bedtime.   FOLLOW UP:  Dr. Lourdes Sledge.                                                Geoffery Lyons, M.D.    IL/MEDQ  D:  05/24/2002  T:  05/25/2002  Job:  [id number]

## 2010-09-26 NOTE — Discharge Summary (Signed)
NAME:  Garrett Guerrero, Garrett Guerrero NO.:  [id number]   MEDICAL RECORD NO.:  [id number]          PATIENT TYPE:  IPS   LOCATION:  0407                          FACILITY:  [facility]   PHYSICIAN:  Anselm Jungling, MD  DATE OF BIRTH:  Sep 26, 1965   DATE OF ADMISSION:  07/05/2007  DATE OF DISCHARGE:  07/07/2007                               DISCHARGE SUMMARY   IDENTIFYING DATA AND REASON FOR ADMISSION:  This was an inpatient  psychiatric admission for Garrett Guerrero, a 45 year old single African American  male, with a history of disability due to schizoaffective disorder.  He  is a client of [facility].  He was admitted due to  exacerbation of symptoms.  Please refer to the admission note for  further details pertaining to the symptoms, circumstances, and history  that led to his hospitalization.  He was given initial Axis I diagnosis  of schizoaffective disorder.   MEDICAL AND LABORATORY:  The patient was medically and physically  assessed in the emergency department and by the psychiatric nurse  practitioner upon admission to the inpatient service.  He was in  generally good health, without any active or chronic medical problems.   HOSPITAL COURSE:  The patient was admitted to the adult inpatient  psychiatric service.  He presented as a well-nourished, well-developed  male who was alert, fully oriented, calm, and cooperative.  His affect  was somewhat flat.  There were no signs or symptoms of thought disorder  on the surface.  There were no delusional statements.  He stated that he  no longer felt that he needed to be in the inpatient service.  He stated  that prior to admission he had had a marked increase in his auditory  hallucinations, but he got a shot in the emergency department the day  before, and on the day of the initial interview stated that he had no  further auditory hallucinations.  He requested discharge.  We endeavored  to have a brief hospital stay.  We  restarted his usual medications (see  below).  His sister was brought in for a family session prior to his  discharge on the second full hospital day.   AFTERCARE:  The patient was to follow up at the [facility], where  he normally gets his psychiatric care, with an appointment on July 12, 2007.   DISCHARGE MEDICATIONS:  1. Prolixin 10 mg q.a.m. and 20 mg at bedtime.  2. Seroquel 300 mg at bedtime.  3. Prozac 40 mg daily.  4. Trazodone 50 mg at bedtime.   DISCHARGE DIAGNOSES:   AXIS I:  Schizoaffective disorder, not otherwise specified.   AXIS II:  Deferred.   AXIS III:  No acute or chronic illnesses.   AXIS IV:  Stressors severe.   AXIS V:  Global assessment of function 55.      Anselm Jungling, MD  Electronically Signed     SPB/MEDQ  D:  08/11/2007  T:  08/12/2007  Job:  [id number]

## 2010-09-26 NOTE — H&P (Signed)
[facility]  Patient:    Garrett Guerrero, Garrett Guerrero Visit Number: [id number] MRN: [id number]          Service Type: PSY Location: 400 0400 02 Attending Physician:  Jeanice Lim Dictated by:   Candi Leash. Orsini, N.P. Admit Date:  09/08/2001                     Psychiatric Admission Assessment  IDENTIFYING INFORMATION:  This is a 45 year old single African-American male voluntarily admitted for psychosis on Sep 08, 2001.  HISTORY OF PRESENT ILLNESS:  The patient presents with a history of positive auditory hallucinations, voices telling him to hurt himself.  He reports that he was doing fairly well after his last discharge from [facility] but states that he "may have gotten upset over something" and did not elaborate as to what that was.  The patient states he was experiencing some anxiety when he was at [facility] [employer] on Wednesday to apply for disability.  He reports that he has seen little green men, experiencing some positive paranoid ideation, felt that the staff is talking about his multiple admissions here.  He states he is having nightmares.  He is trying to get disability to care for his mother.  He is experiencing current visual hallucinations but denies any suicidal or homicidal ideation at this time.  PAST PSYCHIATRIC HISTORY:  Third visit to [facility].  Was recently admitted approximately one month ago for similar complaints.  Sees Dr. Lourdes Sledge on an outpatient basis.  Last visit was September 05, 2001.  No prior suicide attempt.  SOCIAL HISTORY:  This is a 45 year old single black male with no children.  He lives with his mother.  He is out of work.  He was working at a [employer].  He is applying for disability.  FAMILY HISTORY:  Brother with schizophrenia, who is now deceased.  ALCOHOL/DRUG HISTORY:  He smokes.  He denies any alcohol or substance abuse.  PRIMARY CARE Zyree Traynham:  Dr. Donia Guiles.  MEDICAL  PROBLEMS:  None.  MEDICATIONS:  Seroquel 200 mg q.i.d., Risperdal 0.5 mg t.i.d., Lexapro 5 mg q.d., Valium 10 mg t.i.d.  DRUG ALLERGIES:  No known allergies.  PHYSICAL EXAMINATION:  The patient appears as a well-nourished African-American male without complaints.  He appears in no acute distress. His head is normocephalic.  His skin is warm and dry.  His vital signs are temperature 97.6, pulse 99, respirations 20, blood pressure 132/76.  He is 6 feet tall.  He is 198 pounds.  MENTAL STATUS EXAMINATION:  He is an alert, young, adult, African-American male.  He is casually dressed, cooperative.  Speech is normal and relevant. Mood is depressed and flat.  Thought processes are positive visual hallucinations.  No suicidal or homicidal ideation.  Goal directed.  Cognitive function intact.  Memory is fair.  Judgment and insight is fair.  DIAGNOSES: Axis I:    1. Psychosis not otherwise specified.            2. Rule out schizoaffective disorder.            3. Rule out major depression with psychosis. Axis II:   Deferred. Axis III:  None. Axis IV:   Problems with primary support group, economics and other            psychosocial problems. Axis V:    Current 30; estimated this past year 39.  PLAN:  Voluntary admission for psychosis.  Contract for safety.  Check every 15 minutes.  The patient will be placed on the 400 Hall.  Will resume his routine medications.  Will increase his Lexapro and increase his Risperdal to decrease his depressive symptoms and psychosis so patient can be safe.  To follow up with Dr. Lourdes Sledge and to be medication-compliant.  TENTATIVE LENGTH OF STAY:  Three to five days. Dictated by:   Candi Leash. Orsini, N.P. Attending Physician:  Jeanice Lim DD:  09/09/01 TD:  09/11/01 Job: [id number]

## 2010-09-26 NOTE — H&P (Signed)
[facility]  Patient:    Garrett Guerrero, Garrett Guerrero Visit Number: [id number] MRN: [id number]          Service Type: PSY Location: 400 0404 02 Attending Physician:  Jeanice Lim Dictated by:   Jasmine Pang, M.D. Admit Date:  10/29/2001   CC:         Netta Cedars, M.D.   Psychiatric Admission Assessment  IDENTIFYING INFORMATION:  This is a 45 year old African-American male with a history of schizophrenia and frequent admissions to our psychiatric unit.  HISTORY OF PRESENT ILLNESS:  The patient was discharged five weeks ago from the [facility].  He recently decompensated after his mother, whom he lives with, was hospitalized for cardiac problems.  He admits to being very anxious about her since she had to undergo a CABG procedure and is now in rehab.  He states he began to drink beer in an attempt to stop auditory hallucinations which had begun two days prior to admission.  However, his symptoms continued to worsen.  He has a sister who helped care for him and he states she has been giving him his medications on a regular basis.  PAST PSYCHIATRIC HISTORY:  The patient is currently treated by Dr. Lourdes Sledge in the [facility].  MEDICATIONS:  Risperdal 0.5 mg t.i.d. and 1 mg q.h.s.  He is on Cogentin 0.25 mg three times a day.  He is also on Seroquel 800 mg p.o. q.h.s., Valium 10 mg q.i.d. and Lexapro ? dose (probably 10 mg daily).  SOCIAL HISTORY:  As indicated above, he lives with his mother, who is hospitalized for cardiac problems.  His family is supportive.  His sister helps him with his medications.  He has no legal problems.  FAMILY PSYCHIATRIC HISTORY:  Unknown at this time.  SUBSTANCE ABUSE HISTORY:  Denies regular use.  He does admit to using alcohol (32-ounce beer on the day of admission).  PAST MEDICAL HISTORY:  The patient has hypertension.  MEDICATIONS:  Hydrochlorothiazide plus the  psychiatric medications indicated above.  DRUG ALLERGIES:  No known drug allergies.  PHYSICAL EXAMINATION:  See exam to be done by nurse practitioner.  ADMISSION MENTAL STATUS EXAMINATION:  The patient presented as a disheveled African-American male with poor eye contact.  He was cooperative and talkative.  There was psychomotor retardation.  Speech was soft and slow. Mood was depressed and anxious.  He denied current suicidal ideation.  He denied current psychosis or hallucinations now.  He does have some perseverative thought processes, particularly about wanting to be home.  His cognitive exam was alert and oriented x 4.  Short-term and long-term memory adequate.  General fund of knowledge age and education level appropriate. Attention and concentration diminished.  Insight poor.  Judgment poor.  ADMISSION DIAGNOSES: Axis I:    1. Chronic paranoid schizophrenia.            2. Rule out alcohol abuse. Axis II:   Deferred. Axis III:  Hypertension. Axis IV:   Severe. Axis V:    Current Global Assessment of Functioning 30; highest Global            Assessment of Functioning 65.  PROBLEMS:  Chronic schizophrenia with acute decompensation and hallucinations.  SHORT-TERM TREATMENT GOAL:  Resolution and stabilization of hallucinations.  LONG-TERM TREATMENT GOAL:  Stabilization of schizophrenia with continued outpatient treatment and support by family.  PLAN:  Continue current medications.  No apparent need for detox protocol at this point.  He will be monitored for any withdrawal symptoms.  Unit therapeutic groups and activities.  Family session for evaluation and support.  ANTICIPATED LENGTH OF STAY:  Five to seven days.  CONDITION NECESSARY FOR DISCHARGE:  Not psychotic.  POST-HOSPITAL CARE PLAN:  Return home to live with his mother.  Follow-up psychiatric treatment will be at the [facility] in [place] with Dr. Lourdes Sledge. Dictated by:   Jasmine Pang, M.D. Attending Physician:  Jeanice Lim DD:  10/30/01 TD:  10/30/01 Job: [id number]

## 2010-09-26 NOTE — H&P (Signed)
[facility]  Patient:    Garrett Guerrero, Garrett Guerrero Visit Number: [id number] MRN: [id number]          Service Type: PSY Location: 400 0400 02 Attending Physician:  Rachael Fee Dictated by:   Young Berry Scott, N.P. Admit Date:  11/28/2001                     Psychiatric Admission Assessment  DATE OF ADMISSION:  November 28, 2001.  IDENTIFYING INFORMATION:  This is a 45 year old African-American male who is single, voluntary admission.  HISTORY OF THE PRESENT ILLNESS:  This patient with a history of schizoaffective disorder became anxious over the past several weeks due to changes in his psychiatric appointment schedule, complications in his short- term disability.  He became concerned that his disability would be cut off and that he would not have money to pay his bills.  He also has been concerned about his ill mother, who returned from a hospitalization approximately 2 weeks ago.  The patient reports that his sleep has been interrupted by terminal insomnia.  On the day of admission, he awoke at approximately 4 or 5 a.m. and then drove himself here to the hospital requesting admission, stating that he was having fleeting suicidal thoughts of cutting his wrists, and claiming that he was having auditory hallucinations.  The patient is a somewhat unreliable historian.  PAST PSYCHIATRIC HISTORY:  The patient is followed by Dr. Lourdes Sledge in the outpatient clinic at [facility].  This is one of multiple [facility] admissions, just starting this year in 2003, with his first one being in March of 2003.  He has no history of prior suicide attempts and these were his first psychiatric admissions.  SOCIAL HISTORY:  This is an African-American male who is single, never married, no children.  He has an 8th grade education, currently lives with his mother.  His sister is supportive and helps by filling his medication box. The patient had previously  worked at a [employer] and has now filed for disability at the recommendation of his psychiatrist.  FAMILY HISTORY:  Remarkable for brother with a history of some type of mental illness and alcohol abuse.  ALCOHOL AND DRUG HISTORY:  The patient does have a history of alcohol use, although he denies abusing any alcohol recently and states "I quit that a long time ago.  Im not drinking any more."  PAST MEDICAL HISTORY:  The patients primary care physician is unclear.  He does have medical problems of hypertension, for which he takes some hydrochlorothiazide and he reports a recent cough productive of some clear sputum.  He is afebrile, denying any fever or chills.  He denies any acute complaint.  MEDICATIONS: ______ and 0.5 at h.s., Lexapro 10 mg p.o. q.d.  We are unclear if his dose is 10 mg or 20 mg, and Valium 10 mg p.o. q.i.d.  DRUG ALLERGIES:  None.  POSITIVE PHYSICAL FINDINGS:  The patients vital signs on admission to the unit are temp 98.4, pulse 100, respirations 24, blood pressure 130/88.  Please see the physical examination done on the previous admission by Ardeen Fillers, the P.A.  Meanwhile today, he continues to be afebrile.  He has no acute complaints.  His apical pulse is 78.  S1 and S2 heard, no clicks, murmurs or rubs heard.  Apical pulse is synchronous with radial pulse.  His lungs are clear in all fields, no wheezing, no crackles.  Neurologically he is  nonfocal, although he does have some slight psychomotor slowing.  Diagnostic studies reveals hemoglobin 15.6, hematocrit 46.2, WBC at 6.6.  Platelets are 296,00, RDW is within normal limits.  Metabolic panel reveals electrolytes within normal limits.  His glucose was at 126, but this is a random measurement done late in the afternoon.  Alkaline phosphatase mildly elevated at 119.  SGOT 26, SGPT is 35.  We will not repeat his thyroid panel.  MENTAL STATUS EXAMINATION:  This is a large-build male who is muscular.   He has a blunted and mildly anxious affect, some slight psychomotor slowing.  He is fully alert, in no acute distress.  He is polite and cooperative.  His speech is slightly slowed in pace but it is generally relevant.  He has a soft tone.  Mood is somewhat depressed and mildly anxious.  His thought process is logical, he tracks well, no evidence of suicidal ideation today or homicidal ideation.  He is goal directed.  His thought content is primarily concerned with some worries that if he does not respond appropriately to some short term disability correspondence that he received, then they will cut his benefits off and he will not have money to pay for his bills at home and he is quite concerned about his financial stressors, and he is not sure how to cope with the questions that are being asked in the letter that he presents. Cognitively, he is intact and oriented x3.  Intelligence is low average. Insight is poor.  Impulse control and control and judgment are within normal limits.  ADMISSION DIAGNOSES: Axis I:    1. Schizoaffective disorder.            2. 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 Axis II:   Personality disorder not otherwise specified. Axis III:  Hypertension. Axis IV:   Moderate, problems with financial concerns and worries. Axis V:    Current 39, past year 66.  INITIAL PLAN OF CARE:  Voluntarily admit the patient to alleviate his suicidal thoughts and to evaluate some of his trigger stressors.  He is on q.15 minute checks and he is able to contract for safety on the unit.  We are going to continue his current medications at this time and we will not change those. We are going to ask the case manager to evaluate his short term disability issues and his home stressors.  We have discussed with him the option of possibly considering assisted living since his mother is ill and the patient does become anxious with multiple stressors.  He may benefit from a  sheltered living situation; however at this time he is declining it.  We will also ask the case manager to get in touch with the sister and just get her impression  of how he is doing at home.  ESTIMATED LENGTH OF STAY:  2-3 days. Dictated by:   Young Berry Scott, N.P. Attending Physician:  Rachael Fee DD:  11/29/01 TD:  11/29/01 Job: [id number]

## 2010-09-26 NOTE — Discharge Summary (Signed)
NAME:  Garrett Guerrero, Garrett Guerrero                          ACCOUNT NO.:  [id number]   MEDICAL RECORD NO.:  [id number]                   PATIENT TYPE:  PS   LOCATION:  0404                                 FACILITY:  [facility]   PHYSICIAN:  Haskel Khan, M.D.           DATE OF BIRTH:  [birth year]   DATE OF ADMISSION:  10/29/2001  DATE OF DISCHARGE:  10/31/2001                                 DISCHARGE SUMMARY   IDENTIFYING DATA:  This is a 45 year old African-American male with history  of schizophrenia and frequent admissions for worsening of psychotic symptoms  including dangerous ideation.   PAST PSYCHIATRIC HISTORY:  The patient is being treated by Dr. Lourdes Sledge in  the outpatient center.   MEDICATIONS:  Risperdal 0.5 mg t.i.d. and 1 mg q.h.s., Cogentin 0.25 mg  t.i.d., Seroquel 800 mg q.h.s., Valium 10 mg q.i.d., Lexapro 10 mg q.a.m.  Also hydrochlorothiazide.   ALLERGIES:  No known drug allergies.   PHYSICAL EXAMINATION:  Essentially within normal limits.  Neurologically  nonfocal.   MENTAL STATUS EXAM:  The patient presented as a disheveled African-American  male with poor eye contact.  Cooperative.  Somewhat irritable.  There was  psychomotor retardation.  Speech soft and slow.  Mood depressed and anxious.  Thought processes goal directed.  Thought content negative for acute  suicidal or homicidal ideation.  The patient reported hearing voices with  dangerous content.  However denied experiencing this at the time of  evaluation.  Cognitively intact.  Insight and judgment were poor.   ADMISSION DIAGNOSES:   AXIS I:  1. Chronic paranoid schizophrenia.  2. Rule out alcohol abuse.   AXIS II:  None.   AXIS III:  Hypertension.   AXIS IV:  Severe (limited support system).   AXIS V:  30/50.   HOSPITAL COURSE:  The patient was admitted and ordered routine p.r.n.  medications.  Was restabilized on medications which he had not been taking  consistently and was advised regarding  the seriousness of drinking which he  described as the reason for worsening of symptoms which cleared the day  after being admitted.  The patient reported no suicidal or homicidal  ideation or violent ideation.  No mood swings.  No psychotic symptoms.  Reported motivation to remain sober and take medications as directed.  Therefore, he was discharged in improved condition.   DISCHARGE MEDICATIONS:  1. Cogentin 0.5 mg b.i.d.  2. Hydrochlorothiazide 25 mg q.a.m.  3. Lexapro 10 mg q.a.m.  4. Risperdal 0.5 mg t.i.d. and 2 mg q.h.s.  5. Valium 10 mg q.i.d.   FOLLOW UP:  Dr. Lourdes Sledge on December 06, 2001 at 1:30 p.m.   DISCHARGE DIAGNOSES:   AXIS I:  1. Chronic paranoid schizophrenia.  2. Rule out alcohol abuse.   AXIS II:  None.   AXIS III:  Hypertension.   AXIS IV:  Severe (limited support system).   AXIS V:  Global Assessment of Functioning on discharge 50.                                                  Haskel Khan, M.D.    Lovie Macadamia  D:  12/14/2001  T:  12/17/2001  Job:  [id number]

## 2010-09-26 NOTE — H&P (Signed)
NAME:  Garrett Guerrero, Garrett Guerrero                          ACCOUNT NO.:  [id number]   MEDICAL RECORD NO.:  [id number]                   PATIENT TYPE:  IPS   LOCATION:  0407                                 FACILITY:  [facility]   PHYSICIAN:  Geoffery Lyons, M.D.                   DATE OF BIRTH:  [birth year]   DATE OF ADMISSION:  04/20/2002  DATE OF DISCHARGE:                         PSYCHIATRIC ADMISSION ASSESSMENT   IDENTIFYING INFORMATION:  This is a 45 year old African-American male who is  single.  This is a voluntary admission.   HISTORY OF PRESENT ILLNESS:  This single African-American male, with a  history of schizoaffective disorder, reports poor sleep for about three  weeks, saying he just has not been able to sleep through the night.  He  falls asleep fairly easily at 8 p.m. and then awakes around midnight and  tends to remain awake on and off all night.  He also reports that he has  been napping quite a bit during the day.  He reports that, yesterday, he  became panicky on the way to his psychiatrist's office while he was riding  in the car.  When he becomes panicky, he states he begins to hear some  auditory hallucinations.  This all became a bit worse when he arrived at the  psychiatrist's office and realized that there was some mix-up in his  appointment and he would not be seeing his regular doctor.  He told them he  could not promise safety and feared that he would cut his wrist or possibly  harm someone else.  His chief complaint today is my world was caving in.  He denies hearing any auditory hallucinations today, any suicidal ideation  or homicidal ideation.  He says he slept fairly well last evening, although  he spent some time being worried that we would put him away.  This has  been a prominent theme for this patient in the past.  He worries that he  would somehow be sent away to some type of mental institution and, when he  becomes panicked, he states he sees little green men that  speak to him.  He  denies any substance abuse.  He has somewhat limited intellectual capacity.  He reports, today, he has been taking his medications and that his sister  continues to dose them in his medication box.  He denies any auditory  hallucinations today.   PAST PSYCHIATRIC HISTORY:  The patient is followed by Dr. Lourdes Sledge in the  [facility].  This is one of  multiple [facility] admissions.  This is probably the fourth or fifth this year.  The patient has a long history of schizoaffective disorder and is of  somewhat limited intellectual capacity.  His sister manages his medications  and fills his pill box.  He has a supportive family.  No history of legal  charges.  SOCIAL HISTORY:  This is a single African-American male who is 45 years old.  He is never married.  He has no children.  He has an eighth grade education.  His father died when he was 20 years old and he has lived all along with his  mother.  He helps care for her.  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  He is just now begun receiving a disability  check every month for his mental health status and that run a little more  than $800 and he is on [employer] now that he is qualified for  disability.  So, therefore he has no Medicaid at this point to help him pay  for medications and he has been on a prescription assistance program.  He  has no history of legal charges.  No history of substance abuse.   FAMILY HISTORY:  Brother with a history of substance abuse.   PAST MEDICAL HISTORY:  The patient is followed by Dr. Donia Guiles who  manages him for his hypertension, for which he takes HCTZ 12.5 mg daily.   CURRENT MEDICATIONS:  Valium 10 mg q.i.d., Lexapro 10 mg daily, Cogentin 0.5  mg t.i.d., Seroquel 200 mg, 1 b.i.d. and 600 mg q.h.s., Risperdal 0.5 mg  every morning and 3 p.m. and 1 mg q.h.s.   ALLERGIES:  Drug allergies are  none.  The patient is allergic to EGGS.   REVIEW OF SYSTEMS:  Remarkable only for the patient's history of poor sleep  for the past three weeks and occasional auditory hallucinations, primarily  when he becomes a little panicky.  He denies any other previous panic  episodes other than yesterday.  He denies any prior history of seizures.  The patient is a cigarette smoker and reports two weeks of cough productive  of gray sputum but denies fever or chills.   PHYSICAL EXAMINATION:  GENERAL:  This is a well-nourished, well-developed,  African-American male who is of large build.  VITAL SIGNS:  He is 250 pounds, 5 feet 11 inches tall.  Blood pressure was  mildly elevated at 149/83 on admission.  He was also mildly tachycardic at  111 and his temperature is 99.4.  HEENT:  Head is normocephalic and atraumatic.  EENT is within normal limits.  Sclerae nonicteric.  No rhinorrhea.  Oropharynx is noninjected.  NECK:  Supple without thyromegaly.  CARDIOVASCULAR:  S1 and S2 are heard.  No clicks, murmurs or gallops.  No  extra sounds.  Apical pulse is synchronous with radial pulse.  LUNGS:  Sounds reveals coarse rhonchi primarily on the left side.  It does  clear with some coughing.  ABDOMEN:  Rounded, soft, nontender.  GENITALIA:  Deferred.  MUSCULOSKELETAL:  No swelling or deformity of joints.  EXTREMITIES:  Without pedal edema.  Feet are in relatively good condition.  Skin is intact, little callusing, though the patient does have some foot  odor, although he states that he bathes daily.  NEURO:  Cranial nerves 2-12 are intact.  EOMs are intact without nystagmus.  Grip strength equal bilaterally.  Romberg without findings.  No focal  findings.   LABORATORY DATA:  CBC is within normal limits.  Hemoglobin 15, hematocrit  45, MCV 78.4, platelets 311,000.  Electrolytes within normal limits.  The patient's alkaline phosphatase was mildly elevated at 132.  This specimen  was drawn yesterday after he  was admitted to the unit.  His total bilirubin  was  within normal limits at 0.5.  Liver enzymes are normal.  Urinalysis is  within normal limits.  The patient's urine drug screen is currently pending.   MENTAL STATUS EXAM:  This is a tall, large-built male who is overweight.  He  has a blunted affect and appears to be anxious.  He is generally cooperative  and pleasant.  Initially, he is quite fearful.  He is preoccupied with  thoughts that we are going to send him away somewhere.  As soon as we are  able to reassure him that we are not going to send him away or put him away  in a mental place, he relaxes considerably and then begins to ask very  pertinent questions about his symptoms and about his schizophrenia, how long  will he have it, did he do anything wrong to acquire this illness.  He  becomes much more relaxed in conversation and then his mood becomes  euthymic.  Generally, he is cooperative and pleasant today after he engaged  in conversation and begins to feel more secure.  Speech is normal.  Mood is  initially anxious and then euthymic.  No suicidal ideation.  No evidence of  homicidal ideation today in his thoughts.  Thought process is logical,  generally appropriate.  His questions are appropriate.  No evidence of  auditory hallucinations today.  Cognitively, he is intact and oriented x 3.  Thought content:  The patient is of limited intellectual capacity and he  does display some true fears but these are quickly alleviated when he is  given sex and he does not demonstrate any significant paranoia today.  Intellectual capacity is limited.  Insight poor.  Judgment within normal  limits for his intellectual capacity.  Impulse control satisfactory.   DIAGNOSES:   AXIS I:  Schizoaffective disorder, acute exacerbation.   AXIS II:  Deferred.   AXIS III:  Hypertension by history.   AXIS IV:  Moderate (financial stressors, having some economic problems on  his disability and  currently not qualifying for Medicaid assistance with  prescriptions).   AXIS V:  Current 36; past year 55.   PLAN:  Voluntarily admit the patient with 15-minute checks in place.  We  have done a phone conference with his sister today and spoken with her and  redirected her on the way his medications will be given, according to Dr.  Joan Flores previous dosage recommendations.  He had been prescribed to get  Seroquel 200 mg b.i.d. and 600 mg q.h.s.  In fact, at home, he has been  getting 200 mg three times a day and only 400 mg q.h.s.  This could be  contributing to his daytime sedation and lack of sleep at night.  So we have  talked with his sister about making medication changes in his Seroquel.  Meanwhile, we have also advised the patient to consider walking with his  dogs 30 minutes twice daily and try to avoid daytime napping if possible, to increase the amount of water that he is drinking during the day just for  general health and hydration.  We have restarted all of his routine  medications.  That includes the adjustment of Seroquel to 200 mg b.i.d. and  600 mg q.h.s.  We will watch him today and consider adjusting his Lexapro to  15 mg but, at this point, we are not ready to do that.  We will wait and see  the effect of the adjusted Seroquel dose.  Meanwhile, we will  send him for a  chest x-ray to rule out bronchitis and consider calling his primary care  physician for permission to give him a flu immunization while he is here if  we have some available.   ESTIMATED LENGTH OF STAY:  Two to three days.     Margaret A. Scott, N.P.                   Geoffery Lyons, M.D.    MAS/MEDQ  D:  04/21/2002  T:  04/21/2002  Job:  [id number]

## 2011-01-30 LAB — URINALYSIS, ROUTINE W REFLEX MICROSCOPIC
Glucose, UA: NEGATIVE
Ketones, ur: NEGATIVE
Protein, ur: NEGATIVE
Urobilinogen, UA: 1

## 2011-01-30 LAB — BASIC METABOLIC PANEL
CO2: 25
Calcium: 9.6
Creatinine, Ser: 1.06
GFR calc Af Amer: >60
Sodium: 134 — ABNORMAL LOW

## 2011-01-30 LAB — CBC
Hemoglobin: 14.2
RBC: 5.18
WBC: 9.9

## 2011-01-30 LAB — RAPID URINE DRUG SCREEN, HOSP PERFORMED
Amphetamines: NOT DETECTED
Barbiturates: NOT DETECTED
Benzodiazepines: NOT DETECTED

## 2011-01-30 LAB — DIFFERENTIAL
Lymphocytes Relative: 21
Lymphs Abs: 2.1
Monocytes Absolute: 0.3
Monocytes Relative: 3
Neutro Abs: 7.4
Neutrophils Relative %: 75

## 2011-01-30 LAB — ETHANOL: Alcohol, Ethyl (B): <5

## 2011-02-23 LAB — BASIC METABOLIC PANEL
BUN: 7
CO2: 29
Chloride: 100
GFR calc non Af Amer: >60
Glucose, Bld: 100 — ABNORMAL HIGH
Potassium: 4.7
Sodium: 136

## 2011-02-23 LAB — CBC
HCT: 44.5
Hemoglobin: 14.6
MCHC: 32.9
MCV: 83.5
RDW: 16.4 — ABNORMAL HIGH

## 2011-06-15 ENCOUNTER — Encounter: Payer: Self-pay | Admitting: Internal Medicine

## 2011-06-15 ENCOUNTER — Ambulatory Visit (INDEPENDENT_AMBULATORY_CARE_PROVIDER_SITE_OTHER)
Admission: RE | Admit: 2011-06-15 | Discharge: 2011-06-15 | Disposition: A | Discharge status: Home / Self Care | Payer: Medicare Other | Source: Ambulatory Visit | Attending: Internal Medicine | Admitting: Internal Medicine

## 2011-06-15 ENCOUNTER — Ambulatory Visit (INDEPENDENT_AMBULATORY_CARE_PROVIDER_SITE_OTHER): Payer: Medicare Other | Admitting: Internal Medicine

## 2011-06-15 VITALS — BP 110/64 | HR 90 | Temp 97.1°F | Resp 16 | Wt 207.0 lb

## 2011-06-15 DIAGNOSIS — F17200 Nicotine dependence, unspecified, uncomplicated: Secondary | ICD-10-CM

## 2011-06-15 DIAGNOSIS — R05 Cough: Secondary | ICD-10-CM

## 2011-06-15 DIAGNOSIS — R059 Cough, unspecified: Secondary | ICD-10-CM

## 2011-06-15 DIAGNOSIS — J019 Acute sinusitis, unspecified: Secondary | ICD-10-CM

## 2011-06-15 MED ORDER — AMOXICILLIN-POT CLAVULANATE 500-125 MG PO TABS: 1.0000 | ORAL_TABLET | Freq: Three times a day (TID) | ORAL | Status: AC

## 2011-06-15 NOTE — Assessment & Plan Note (Signed)
Check a CXR to look for pna, mass, edema

## 2011-06-15 NOTE — Progress Notes (Signed)
Subjective:    Patient ID: Garrett Guerrero, male    DOB: 1965-08-28, 46 y.o.   MRN: [id number]  Cough This is a new problem. The current episode started 1 to 4 weeks ago. The problem has been gradually worsening. The problem occurs every few hours. The cough is productive of purulent sputum. Associated symptoms include chills, nasal congestion, postnasal drip, rhinorrhea and a sore throat. Pertinent negatives include no chest pain, ear congestion, ear pain, fever, headaches, heartburn, hemoptysis, myalgias, rash, shortness of breath, sweats, weight loss or wheezing. The symptoms are aggravated by nothing. Risk factors for lung disease include smoking/tobacco exposure. He has tried nothing for the symptoms.      Review of Systems  Constitutional: Positive for chills. Negative for fever, weight loss, diaphoresis, activity change, appetite change, fatigue and unexpected weight change.  HENT: Positive for sore throat, rhinorrhea, postnasal drip and sinus pressure. Negative for ear pain, nosebleeds, congestion, facial swelling, trouble swallowing, neck pain, dental problem, voice change and tinnitus.   Eyes: Negative.   Respiratory: Negative for apnea, cough, hemoptysis, choking, chest tightness, shortness of breath, wheezing and stridor.   Cardiovascular: Negative for chest pain, palpitations and leg swelling.  Gastrointestinal: Negative for heartburn, nausea, vomiting, abdominal pain, diarrhea, constipation and anal bleeding.  Genitourinary: Negative.   Musculoskeletal: Negative.  Negative for myalgias.  Skin: Negative for color change, pallor, rash and wound.  Neurological: Negative for dizziness, tremors, seizures, syncope, facial asymmetry, speech difficulty, weakness, light-headedness, numbness and headaches.  Hematological: Negative for adenopathy. Does not bruise/bleed easily.  Psychiatric/Behavioral: Negative.        Objective:   Physical Exam  Vitals reviewed. Constitutional: He is  oriented to person, place, and time. He appears well-developed and well-nourished. No distress.  HENT:  Head: Normocephalic and atraumatic. No trismus in the jaw.  Right Ear: Hearing, tympanic membrane, external ear and ear canal normal.  Left Ear: Hearing, tympanic membrane, external ear and ear canal normal.  Nose: Mucosal edema and rhinorrhea present. No nose lacerations, sinus tenderness, nasal deformity, septal deviation or nasal septal hematoma. No epistaxis.  No foreign bodies. Right sinus exhibits maxillary sinus tenderness. Right sinus exhibits no frontal sinus tenderness. Left sinus exhibits maxillary sinus tenderness. Left sinus exhibits no frontal sinus tenderness.  Mouth/Throat: Oropharynx is clear and moist. Mucous membranes are not pale, not dry and not cyanotic. No uvula swelling. No oropharyngeal exudate, posterior oropharyngeal edema, posterior oropharyngeal erythema or tonsillar abscesses.  Eyes: Conjunctivae are normal. Right eye exhibits no discharge. Left eye exhibits no discharge. No scleral icterus.  Neck: Normal range of motion. Neck supple. No JVD present. No tracheal deviation present.  Cardiovascular: Normal rate, regular rhythm, normal heart sounds and intact distal pulses.  Exam reveals no gallop and no friction rub.   No murmur heard. Pulmonary/Chest: Effort normal and breath sounds normal. No stridor. No respiratory distress. He has no wheezes. He has no rales. He exhibits no tenderness.  Abdominal: Soft. Bowel sounds are normal. He exhibits no distension and no mass. There is no tenderness. There is no rebound and no guarding.  Musculoskeletal: Normal range of motion. He exhibits no edema and no tenderness.  Lymphadenopathy:    He has no cervical adenopathy.  Neurological: He is oriented to person, place, and time.  Skin: Skin is warm and dry. No rash noted. He is not diaphoretic. No erythema. No pallor.  Psychiatric: He has a normal mood and affect. His behavior is  normal. Judgment and thought content normal.  Assessment & Plan:

## 2011-06-15 NOTE — Assessment & Plan Note (Signed)
He agrees to quit smoking 

## 2011-06-15 NOTE — Assessment & Plan Note (Signed)
Start augmentin 

## 2011-06-15 NOTE — Patient Instructions (Signed)

## 2011-07-02 ENCOUNTER — Encounter: Payer: Self-pay | Admitting: Internal Medicine

## 2011-07-06 ENCOUNTER — Encounter: Payer: Self-pay | Admitting: Internal Medicine

## 2011-07-06 ENCOUNTER — Ambulatory Visit (INDEPENDENT_AMBULATORY_CARE_PROVIDER_SITE_OTHER): Payer: Medicare Other | Admitting: Internal Medicine

## 2011-07-06 VITALS — BP 120/78 | HR 79 | Temp 97.6°F | Ht 69.0 in | Wt 206.8 lb

## 2011-07-06 DIAGNOSIS — F329 Major depressive disorder, single episode, unspecified: Secondary | ICD-10-CM | POA: Insufficient documentation

## 2011-07-06 DIAGNOSIS — F32A Depression, unspecified: Secondary | ICD-10-CM | POA: Insufficient documentation

## 2011-07-06 DIAGNOSIS — F209 Schizophrenia, unspecified: Secondary | ICD-10-CM | POA: Insufficient documentation

## 2011-07-06 DIAGNOSIS — J320 Chronic maxillary sinusitis: Secondary | ICD-10-CM

## 2011-07-06 DIAGNOSIS — F17200 Nicotine dependence, unspecified, uncomplicated: Secondary | ICD-10-CM

## 2011-07-06 MED ORDER — FLUTICASONE PROPIONATE 50 MCG/ACT NA SUSP: 2.0000 | Freq: Every day | NASAL | Status: AC

## 2011-07-06 MED ORDER — DOXYCYCLINE HYCLATE 100 MG PO TABS: 100.0000 mg | ORAL_TABLET | Freq: Two times a day (BID) | ORAL | Status: AC

## 2011-07-06 NOTE — Progress Notes (Signed)
  Subjective:    Patient ID: Garrett Guerrero, male    DOB: 01-14-1966, 46 y.o.   MRN: [id number]  HPI New patient to me but known to my practice, transferring from my partner  Complains of ongoing  sinus congestion Productive of yellow- green phlegm nasal discharge Not associated with fever, sinus pain, headache or vision change No shortness of breath or chest pain Ongoing tobacco abuse  Past Medical History  Diagnosis Date  . Allergy   . Depression   . TOBACCO USE   . Schizophrenia     Review of Systems  Constitutional: Negative for fever and fatigue.  HENT: Negative for hearing loss, neck pain and neck stiffness.   Eyes: Negative for pain and itching.  Cardiovascular: Negative for chest pain, palpitations and leg swelling.       Objective:   Physical Exam BP 120/78  Pulse 79  Temp(Src) 97.6 F (36.4 C) (Oral)  Ht 5\' 9"  (1.753 m)  Wt 206 lb 12.8 oz (93.804 kg)  BMI 30.54 kg/m2  SpO2 96% Wt Readings from Last 3 Encounters:  07/06/11 206 lb 12.8 oz (93.804 kg)  06/15/11 207 lb (93.895 kg)  02/03/10 199 lb 4 oz (90.379 kg)   Constitutional:  He appears well-developed and well-nourished. No distress.  HENT: NCAT, sinus mild tender to palpation B maxillary region, thick yellow discharge visible R nostril - no polyps visible,  OP clear  Neck: Normal range of motion. Neck supple. No JVD present. No thyromegaly present.  Cardiovascular: Normal rate, regular rhythm and normal heart sounds.  No murmur heard. no BLE edema Pulmonary/Chest: Effort normal and breath sounds normal. No respiratory distress. no wheezes.  Neurological: he is alert and oriented to person, place, and time. No cranial nerve deficit. Coordination normal.  Skin: Skin is warm and dry.  No erythema or ulceration.  Psychiatric: he has a normal mood and affect. behavior is normal. Judgment and thought content normal.   Lab Results  Component Value Date   WBC 9.9 07/05/2007   HGB 14.2 07/05/2007   HCT 41.8  07/05/2007   PLT 370 07/05/2007   GLUCOSE 124* 07/05/2007   NA 134* 07/05/2007   K 4.1 07/05/2007   CL 104 07/05/2007   CREATININE 1.06 07/05/2007   BUN 12 07/05/2007   CO2 25 07/05/2007   Dg Chest 2 View  06/15/2011  *RADIOLOGY REPORT*  Clinical Data: Cough for 1 week  CHEST - 2 VIEW  Comparison: 07/25/2008a  Findings: There is mild bronchial thickening.  I think there may be minimal patchy infiltrate at the lung bases and in the right midlung.  No dense consolidation or major collapse.  No effusions. Bony structures unremarkable.  IMPRESSION: Suspicion of bronchitis and minimal patchy infiltrate at the bases.  Original Report Authenticated By: Thomasenia Sales, M.D.       Assessment & Plan:  Acute on chronic sinusitis - no response to Augmenting earlier this month- change to doxy (FQ contraindicated with Seroquel) and add nasal steroid -   Also See problem list. Medications and labs reviewed today.

## 2011-07-06 NOTE — Patient Instructions (Signed)
It was good to see you today. Doxy antibiotics twice a day x10 days and nasal steroid spray everyday (even after sinus congestion symptoms  - Your prescription(s) have been submitted to your pharmacy. Please take as directed and contact our office if you believe you are having problem(s) with the medication(s). Please schedule followup in 3-4 months for "physical" and labs, call sooner if problems. Consider quitting smoking - smoking is very bad for your health You Can Quit Smoking If you are ready to quit smoking or are thinking about it, congratulations! You have chosen to help yourself be healthier and live longer! There are lots of different ways to quit smoking. Nicotine gum, nicotine patches, a nicotine inhaler, or nicotine nasal spray can help with physical craving. Hypnosis, support groups, and medicines help break the habit of smoking. TIPS TO GET OFF AND STAY OFF CIGARETTES  Learn to predict your moods. Do not let a bad situation be your excuse to have a cigarette. Some situations in your life might tempt you to have a cigarette.     Ask friends and co-workers not to smoke around you.     Make your home smoke-free.     Never have "just one" cigarette. It leads to wanting another and another. Remind yourself of your decision to quit.     On a card, make a list of your reasons for not smoking. Read it at least the same number of times a day as you have a cigarette. Tell yourself everyday, "I do not want to smoke. I choose not to smoke."     Ask someone at home or work to help you with your plan to quit smoking.     Have something planned after you eat or have a cup of coffee. Take a walk or get other exercise to perk you up. This will help to keep you from overeating.     Try a relaxation exercise to calm you down and decrease your stress. Remember, you may be tense and nervous the first two weeks after you quit. This will pass.     Find new activities to keep your hands busy. Play  with a pen, coin, or rubber band. Doodle or draw things on paper.     Brush your teeth right after eating. This will help cut down the craving for the taste of tobacco after meals. You can try mouthwash too.     Try gum, breath mints, or diet candy to keep something in your mouth.  IF YOU SMOKE AND WANT TO QUIT:  Do not stock up on cigarettes. Never buy a carton. Wait until one pack is finished before you buy another.     Never carry cigarettes with you at work or at home.     Keep cigarettes as far away from you as possible. Leave them with someone else.     Never carry matches or a lighter with you.     Ask yourself, "Do I need this cigarette or is this just a reflex?"     Bet with someone that you can quit. Put cigarette money in a piggy bank every morning. If you smoke, you give up the money. If you do not smoke, by the end of the week, you keep the money.     Keep trying. It takes 21 days to change a habit!     Talk to your doctor about using medicines to help you quit. These include nicotine replacement gum, lozenges, or skin  patches.  Document Released: 02/21/2009 Document Revised: 01/07/2011 Document Reviewed: 02/21/2009 [facility] Patient Information 2012 [place], [place].

## 2011-07-06 NOTE — Assessment & Plan Note (Signed)
Symptoms appear stable the current medications - Reports following every few months with Manuela Neptune for med review and mgmt The current medical regimen is effective;  continue present plan and medications.

## 2011-07-06 NOTE — Assessment & Plan Note (Signed)
5 minutes today spent counseling patient on unhealthy effects of continued tobacco abuse and encouragement of cessation including medical options available to help the patient quit smoking. 

## 2012-02-24 ENCOUNTER — Ambulatory Visit (INDEPENDENT_AMBULATORY_CARE_PROVIDER_SITE_OTHER): Payer: Medicare Other | Admitting: *Deleted

## 2012-02-24 DIAGNOSIS — Z23 Encounter for immunization: Secondary | ICD-10-CM

## 2013-03-09 ENCOUNTER — Ambulatory Visit (INDEPENDENT_AMBULATORY_CARE_PROVIDER_SITE_OTHER): Payer: Medicare Other

## 2013-03-09 DIAGNOSIS — Z23 Encounter for immunization: Secondary | ICD-10-CM

## 2013-05-18 ENCOUNTER — Other Ambulatory Visit (INDEPENDENT_AMBULATORY_CARE_PROVIDER_SITE_OTHER): Payer: Medicare Other

## 2013-05-18 ENCOUNTER — Ambulatory Visit (INDEPENDENT_AMBULATORY_CARE_PROVIDER_SITE_OTHER): Payer: Medicare Other | Admitting: Internal Medicine

## 2013-05-18 ENCOUNTER — Ambulatory Visit (INDEPENDENT_AMBULATORY_CARE_PROVIDER_SITE_OTHER)
Admission: RE | Admit: 2013-05-18 | Discharge: 2013-05-18 | Disposition: A | Discharge status: Home / Self Care | Payer: Medicare Other | Source: Ambulatory Visit | Attending: Internal Medicine | Admitting: Internal Medicine

## 2013-05-18 ENCOUNTER — Encounter: Payer: Self-pay | Admitting: Internal Medicine

## 2013-05-18 VITALS — BP 128/90 | HR 80 | Temp 97.1°F | Resp 16 | Ht 69.0 in | Wt 211.0 lb

## 2013-05-18 DIAGNOSIS — R109 Unspecified abdominal pain: Secondary | ICD-10-CM

## 2013-05-18 DIAGNOSIS — R7309 Other abnormal glucose: Secondary | ICD-10-CM

## 2013-05-18 DIAGNOSIS — R52 Pain, unspecified: Secondary | ICD-10-CM

## 2013-05-18 LAB — CBC WITH DIFFERENTIAL/PLATELET
BASOS ABS: 0 10*3/uL (ref 0.0–0.1)
Basophils Relative: 0.2 % (ref 0.0–3.0)
EOS ABS: 0 10*3/uL (ref 0.0–0.7)
Eosinophils Relative: 0.2 % (ref 0.0–5.0)
HEMATOCRIT: 39.6 % (ref 39.0–52.0)
Hemoglobin: 13.2 g/dL (ref 13.0–17.0)
LYMPHS ABS: 1.5 10*3/uL (ref 0.7–4.0)
Lymphocytes Relative: 19.6 % (ref 12.0–46.0)
MCHC: 33.3 g/dL (ref 30.0–36.0)
MCV: 81.1 fl (ref 78.0–100.0)
MONOS PCT: 3.9 % (ref 3.0–12.0)
Monocytes Absolute: 0.3 10*3/uL (ref 0.1–1.0)
NEUTROS ABS: 5.7 10*3/uL (ref 1.4–7.7)
Neutrophils Relative %: 76.1 % (ref 43.0–77.0)
PLATELETS: 313 10*3/uL (ref 150.0–400.0)
RBC: 4.88 Mil/uL (ref 4.22–5.81)
RDW: 14.7 % — AB (ref 11.5–14.6)
WBC: 7.4 10*3/uL (ref 4.5–10.5)

## 2013-05-18 LAB — URINALYSIS, ROUTINE W REFLEX MICROSCOPIC
BILIRUBIN URINE: NEGATIVE
HGB URINE DIPSTICK: NEGATIVE
KETONES UR: NEGATIVE
LEUKOCYTES UA: NEGATIVE
NITRITE: NEGATIVE
PH: 7.5 (ref 5.0–8.0)
Specific Gravity, Urine: 1.025 (ref 1.000–1.030)
Total Protein, Urine: NEGATIVE
UROBILINOGEN UA: 0.2 (ref 0.0–1.0)
Urine Glucose: NEGATIVE

## 2013-05-18 LAB — COMPREHENSIVE METABOLIC PANEL
ALBUMIN: 4.4 g/dL (ref 3.5–5.2)
ALK PHOS: 99 U/L (ref 39–117)
ALT: 22 U/L (ref 0–53)
AST: 22 U/L (ref 0–37)
BUN: 12 mg/dL (ref 6–23)
CO2: 28 meq/L (ref 19–32)
Calcium: 9.6 mg/dL (ref 8.4–10.5)
Chloride: 105 mEq/L (ref 96–112)
Creatinine, Ser: 1.2 mg/dL (ref 0.4–1.5)
GFR: 85.79 mL/min (ref >60.00)
GLUCOSE: 118 mg/dL — AB (ref 70–99)
POTASSIUM: 5.1 meq/L (ref 3.5–5.1)
SODIUM: 140 meq/L (ref 135–145)
TOTAL PROTEIN: 8.3 g/dL (ref 6.0–8.3)
Total Bilirubin: 0.7 mg/dL (ref 0.3–1.2)

## 2013-05-18 LAB — HEMOGLOBIN A1C: HEMOGLOBIN A1C: 5.5 % (ref 4.6–6.5)

## 2013-05-18 MED ORDER — HYDROCODONE-ACETAMINOPHEN 5-325 MG PO TABS: 1.0000 | ORAL_TABLET | Freq: Four times a day (QID) | ORAL | Status: AC | PRN

## 2013-05-18 NOTE — Patient Instructions (Signed)
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
## 2013-05-19 NOTE — Assessment & Plan Note (Signed)
He has very mild pre-diabetes 

## 2013-05-19 NOTE — Assessment & Plan Note (Signed)
xrays and labs show no evidence of an internal injury This is c/w a contusion Will control the pain with norco as needed
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
## 2015-11-21 DIAGNOSIS — D125 Benign neoplasm of sigmoid colon: Secondary | ICD-10-CM

## 2015-11-21 DIAGNOSIS — Z86010 Personal history of colonic polyps: Principal | ICD-10-CM

## 2015-11-21 DIAGNOSIS — K648 Other hemorrhoids: Secondary | ICD-10-CM

## 2015-11-21 DIAGNOSIS — Z79899 Other long term (current) drug therapy: Secondary | ICD-10-CM

## 2015-12-26 DIAGNOSIS — F259 Schizoaffective disorder, unspecified: Secondary | ICD-10-CM

## 2015-12-30 ENCOUNTER — Ambulatory Visit: Primary: Internal Medicine

## 2015-12-30 DIAGNOSIS — K648 Other hemorrhoids: Secondary | ICD-10-CM

## 2015-12-30 DIAGNOSIS — D125 Benign neoplasm of sigmoid colon: Secondary | ICD-10-CM

## 2015-12-30 DIAGNOSIS — Z79899 Other long term (current) drug therapy: Secondary | ICD-10-CM

## 2015-12-30 DIAGNOSIS — F259 Schizoaffective disorder, unspecified: Principal | ICD-10-CM

## 2015-12-30 DIAGNOSIS — Z86010 Personal history of colonic polyps: Principal | ICD-10-CM

## 2016-01-07 ENCOUNTER — Ambulatory Visit

## 2016-01-07 ENCOUNTER — Inpatient Hospital Stay

## 2016-01-07 DIAGNOSIS — Z86010 Personal history of colonic polyps: Principal | ICD-10-CM

## 2016-01-07 DIAGNOSIS — K648 Other hemorrhoids: Secondary | ICD-10-CM

## 2016-01-07 DIAGNOSIS — F259 Schizoaffective disorder, unspecified: Secondary | ICD-10-CM

## 2016-01-07 DIAGNOSIS — Z79899 Other long term (current) drug therapy: Secondary | ICD-10-CM

## 2016-01-07 DIAGNOSIS — D125 Benign neoplasm of sigmoid colon: Secondary | ICD-10-CM

## 2016-01-07 MED ORDER — LACTATED RINGERS IV SOLN
Freq: Once | INTRAVENOUS | Status: CP
Start: 2016-01-07 — End: ?

## 2016-01-07 MED ORDER — PROPOFOL 10 MG/ML IV EMUL CUSTOM SH
Status: DC | PRN
Start: 2016-01-07 — End: 2016-01-07

## 2016-01-07 MED ORDER — LACTATED RINGERS IV SOLN
Status: DC | PRN
Start: 2016-01-07 — End: 2016-01-07

## 2016-01-07 NOTE — Progress Notes
Pt arrived via stretcher, connected to monitor, report received from OR RN & CRNA, VSS, will continue to monitor

## 2016-01-07 NOTE — H&P
Department of Medicine  Division of Gastroenterology, Hepatology & Nutrition    Procedure Planned: Colonoscopy    Reason for Procedure: Surveillance for personal history of polyps    Reason for Procedure: h/o colon polyps    History: Past Medical History:   Diagnosis Date   ? Schizoaffective disorder      Past Surgical History:   Procedure Laterality Date   ? COLONOSCOPY W/ OR W/O BIOPSY N/A 11/05/2015   ? KNEE ARTHROSCOPY Right     Repaired Ligaments   ? WRIST SURGERY Right 2012        I have reviewed the past medical and surgical, history.    Home Medications:  Prescriptions Prior to Admission   Medication Sig   ? Cyanocobalamin (VITAMIN B 12 PO) Take 1 tablet by mouth daily.   ? FLUoxetine (PROzac) 40 MG Capsule Take 1 capsule by mouth daily.   ? fluPHENAZine (PROLIXIN) 10 MG Tablet Take one tablet in the morning and two tablets at night   ? GARLIC PO Take 1 tablet by mouth daily.   ? ibuprofen (ADVIL,MOTRIN) 600 MG Tablet TAKE 1 TABLET BY MOUTH EVERY 8 HOURS AS NEEDED FOR PAIN   ? PEG-3350 and electrolytes (GoLYTELY) 236 G Solution Reconstituted Use as directed   ? QUEtiapine (SEROquel) 400 MG Tablet Take 1 tablet by mouth nightly at bedtime.   ? traZODone (DESYREL) 50 MG Tablet Take 1 tablet by mouth nightly at bedtime.   ? Vitamin D 2000 UNITS Capsule Take 1 caplet by mouth daily.       Current Hospital Medications:  Scheduled:   Continuous Infusions:   PRN:      Allergies:  has No Known Allergies.    Review of Systems  ROS negative including cv systems    Airway: Open, uncompromised    NPO since: midnight        Physical Exam  General appearance: alert, cooperative, no distress, appears stated age  Lungs: clear to auscultation bilaterally  Heart: regular rate and rhythm, S1, S2 normal, no murmur, click, rub or gallop  Abdomen: soft, non-tender; bowel sounds normal; no masses, no organomegaly  Extremities: extremities normal, atraumatic, no cyanosis or edema    Assessment / Diagnosis:

## 2016-01-07 NOTE — Progress Notes
Pt ambulated to wheelchair, denies pain or any needs at this time

## 2016-01-07 NOTE — H&P
50 yo male with ah/o colon polyps who presents for f/u    ASA:  Class II ? patient with mild systemic disease    Previous Anesthesia: None known    Problems/Reaction: MAC    Any Contraindications for Sedation?: no    Anesthetic/Sedation Plan: General/MAC Anesthesia requested    Options and risks discussed with Patient    Sedation consent signed and in Medical Record: yes    In light of the above evaluation, I believe this patient is an acceptable candidate for the procedure planned as outlined above.    Seth Chase  01/07/2016 10:32 AM

## 2016-01-07 NOTE — Progress Notes
Discharge teaching to Pt, voiced understanding, denies pain or any needs at this time.

## 2016-01-07 NOTE — H&P
Department of Medicine  Division of Gastroenterology, Hepatology & Nutrition    Procedure Planned: Colonoscopy    Reason for Procedure: Surveillance for personal history of polyps    Reason for Procedure: h/o colon polyps    History: Past Medical History:   Diagnosis Date   ? Schizoaffective disorder      Past Surgical History:   Procedure Laterality Date   ? COLONOSCOPY W/ OR W/O BIOPSY N/A 11/05/2015   ? KNEE ARTHROSCOPY Right     Repaired Ligaments   ? WRIST SURGERY Right 2012        I have reviewed the past medical and surgical, history.    Home Medications:  Prescriptions Prior to Admission   Medication Sig   ? Cyanocobalamin (VITAMIN B 12 PO) Take 1 tablet by mouth daily.   ? FLUoxetine (PROzac) 40 MG Capsule Take 1 capsule by mouth daily.   ? fluPHENAZine (PROLIXIN) 10 MG Tablet Take one tablet in the morning and two tablets at night   ? GARLIC PO Take 1 tablet by mouth daily.   ? ibuprofen (ADVIL,MOTRIN) 600 MG Tablet TAKE 1 TABLET BY MOUTH EVERY 8 HOURS AS NEEDED FOR PAIN   ? PEG-3350 and electrolytes (GoLYTELY) 236 G Solution Reconstituted Use as directed   ? QUEtiapine (SEROquel) 400 MG Tablet Take 1 tablet by mouth nightly at bedtime.   ? traZODone (DESYREL) 50 MG Tablet Take 1 tablet by mouth nightly at bedtime.   ? Vitamin D 2000 UNITS Capsule Take 1 caplet by mouth daily.       Current Hospital Medications:  Scheduled:   Continuous Infusions:   PRN:      Allergies:  has No Known Allergies.    Review of Systems  ROS negative including cv systems    Airway: Open, uncompromised    NPO since: midnight        Physical Exam  General appearance: alert, cooperative, no distress, appears stated age  Lungs: clear to auscultation bilaterally  Heart: regular rate and rhythm, S1, S2 normal, no murmur, click, rub or gallop  Abdomen: soft, non-tender; bowel sounds normal; no masses, no organomegaly  Extremities: extremities normal, atraumatic, no cyanosis or edema    Assessment / Diagnosis:

## 2016-01-07 NOTE — H&P
50 yo male with ah/o colon polyps who presents for f/u    ASA:  Class II ? patient with mild systemic disease    Previous Anesthesia: None known    Problems/Reaction: MAC    Any Contraindications for Sedation?: no    Anesthetic/Sedation Plan: General/MAC Anesthesia requested    Options and risks discussed with Patient    Sedation consent signed and in MEDICAL RECORD NUMBERyes    In light of the above evaluation, I believe this patient is an acceptable candidate for the procedure planned as outlined above.    [facility] Horacio Malespin  01/07/2016 10:32 AM

## 2016-01-07 NOTE — Progress Notes
Discharge teaching to Pt, voiced understanding, denies pain or any needs at this time.

## 2016-01-07 NOTE — Anesthesia Preprocedure Evaluation
Department of Anesthesiology  Pre-operative Evaluation Record    Patient Name: Seth Chase                  Sex: male                  Age: 50 y.o.                  MRN: [id number]     Allergies:   Review of patient's allergies indicates no known allergies.    No outpatient prescriptions have been marked as taking for the 12/30/15 encounter (Appointment) with PHONE PAT.       Prior Surgeries:     Past Surgical History:   Procedure Laterality Date   ? COLONOSCOPY W/ OR W/O BIOPSY N/A 11/05/2015   ? KNEE ARTHROSCOPY Right     Repaired Ligaments   ? WRIST SURGERY Right 2012       Social   ETOH:       Alcohol Use No     Tobacco:   History   Smoking Status   ? Former Smoker   ? Packs/day: 1.00   ? Years: 5.00   ? Types: Cigarettes   ? Quit date: 05/05/2013   Smokeless Tobacco   ? Never Used     Drugs:   History   Drug Use No       Evaluation:   ROS / Med Hx:  Negative for review of systems and medical history except otherwise noted below:    Neuro/Psych: Schizoaffective Disorder.   Cardiovascular: Pt reports HTN but states that he controls it with coconut milk.     Physical Exam:  Airway: Mallampati score of II.   Pulmonary:  On pulmonary examination breath sounds clear to auscultation.   Cardiovascular: On cardiovascular examination regular rate and rhythm.     Anesthesia Plan:  Patient has an ASA score of 2 and patient is NPO. Plan for anesthesia technique is general.  With an intravenous type of induction. Anesthetic plan and risks has been discussed with the patient.     I have performed a pre-anesthesia examination, evaluation, and prescribed the anesthesia plan.

## 2016-01-07 NOTE — Anesthesia Postprocedure Evaluation
Post Anesthesia Eval    Respiratory function appropriate( normal respiratory rate and oxygen saturation, airway patent )  Cardiovascular function appropriate( normal heart rate and blood pressure )  Mental status appropriate  Patient normothermic  Pain well controlled  No uncontrolled nausea and vomiting  Patient appropriately hydrated  No apparent anesthesia complications  Patient tolerated procedure well

## 2016-01-07 NOTE — Anesthesia Preprocedure Evaluation
Department of Anesthesiology  Pre-operative Evaluation Record    Patient Name: Seth Chase                  Sex: male                  Age: 50 y.o.                  MRN: [id number]     Allergies:   Review of patient's allergies indicates no known allergies.    No outpatient prescriptions have been marked as taking for the 12/30/15 encounter (Appointment) with PHONE PAT.       Prior Surgeries:     Past Surgical History:   Procedure Laterality Date   ? COLONOSCOPY W/ OR W/O BIOPSY N/A 11/05/2015   ? KNEE ARTHROSCOPY Right     Repaired Ligaments   ? WRIST SURGERY Right 2012       Social   ETOH:       Alcohol Use No     Tobacco:   History   Smoking Status   ? Former Smoker   ? Packs/day: 1.00   ? Years: 5.00   ? Types: Cigarettes   ? Quit date: 05/05/2013   Smokeless Tobacco   ? Never Used     Drugs:   History   Drug Use No       Evaluation:   ROS / Med Hx:  Negative for review of systems and medical history except otherwise noted below:    Neuro/Psych: Schizoaffective Disorder.   Cardiovascular: Pt reports HTN but states that he controls it with coconut milk.

## 2016-01-08 DIAGNOSIS — F259 Schizoaffective disorder, unspecified: Principal | ICD-10-CM

## 2016-01-08 NOTE — Addendum Note
Addendum  created 01/08/16 1425 by Orvil Feilasika, Jayanth, MD    Anesthesia Attestations deleted, Anesthesia Attestations filed

## 2016-02-13 DIAGNOSIS — F259 Schizoaffective disorder, unspecified: Principal | ICD-10-CM

## 2016-02-14 MED ORDER — FLUOXETINE HCL 40 MG PO CAPS
0 refills | Status: CP
Start: 2016-02-14 — End: 2016-03-03

## 2016-02-14 MED ORDER — TRAZODONE HCL 50 MG PO TABS
0 refills | Status: CP
Start: 2016-02-14 — End: 2016-03-03

## 2016-02-14 MED ORDER — QUETIAPINE FUMARATE 400 MG PO TABS
0 refills | Status: CP
Start: 2016-02-14 — End: 2016-03-03

## 2016-03-03 ENCOUNTER — Ambulatory Visit: Attending: Mental Health | Primary: Internal Medicine

## 2016-03-03 DIAGNOSIS — F251 Schizoaffective disorder, depressive type: Principal | ICD-10-CM

## 2016-03-03 MED ORDER — QUETIAPINE FUMARATE 400 MG PO TABS
3 refills | Status: CP
Start: 2016-03-03 — End: 2016-06-19

## 2016-03-03 MED ORDER — TRAZODONE HCL 50 MG PO TABS
3 refills | Status: CP
Start: 2016-03-03 — End: 2016-06-19

## 2016-03-03 MED ORDER — FLUOXETINE HCL 40 MG PO CAPS
40 mg | Freq: Every day | ORAL | 3 refills | Status: CP
Start: 2016-03-03 — End: 2016-06-19

## 2016-03-03 MED ORDER — FLUPHENAZINE HCL 10 MG PO TABS
3 refills | Status: CP
Start: 2016-03-03 — End: 2016-06-19

## 2016-03-03 NOTE — Progress Notes
Reason For Visit   Patient Name: Seth Chase [place]  MRN: [id number]  Date of Birth:  03/22/1966    Service: Medication Management   Date of Service: 03/03/2016  Code: [id number]  Duration: 15 minutes   Begin Time: 9:25 AM End Time: 9:40 AM      Provider: Verne Spurrhristopher Calandro, ARNP   Location: [facility] Department of Psychiatry     Informants: Patient  Other Agencies Involved: None  Primary Language: English    Chief Complaint: "Schizoaffective Disorder"    Subjective   Seth Chase Otten is a 50 y.o. male presenting for medication management of Schizoaffective Disorder.  This is a stable patient who has been on the same combination of medications without changes for many years now.  His symptoms have remained largely in remission for 10+ years.  He denies any depression but does admit to occasional voices but not severe, is easily able to ignore them.  He spends his time playing basketball at the gym, going for long walks, and helping his brother with household chores.  Denies any adverse effects from medications.  No changes since last visit, no complaints or concerns.      Objective   This is a 50 y.o. male appropriately dressed and groomed, cooperative with the interview. Speech was appropriate in rate and volume. Patient is alert and oriented to person, place and time. Affect was calm, friendly. Mood was congruent. Thought process is logical and coherent. There are no apparent delusions. Patient is denying current suicidal ideation or homicidal ideation, or hallucinations. Cognition is grossly intact, although not formally tested. Insight and judgment are fair. There are no abnormal involuntary movements noted on exam. Patient has normal muscle strength and tone and a steady gait. Psychomotor activity was normal. Suicidality risk was none. Violence risk was none. Thinking was future oriented. Mental attitude was positive.       ROS  All systems unremarkable except for Psychiatric and musculoskeletal: knee pain      Assessment

## 2016-03-03 NOTE — Progress Notes
Axis I: 295.70 Schizoaffective Disorder, depressed type   Axis II: deferred   Axis III: none   Axis IV: chronic mental illness  Axis V: Global assessment of Functioning: 61-70     Plan:   1. Continue medications as previously prescribed: PROZAC 40 mg daily, SEROQUEL 400 mg at night, FLUPHENAZINE 10 mg one tablet in the morning and two tablets at night, and TRAZODONE 50 mg at night.   2. Reviewed risks, benefits, and potential adverse effects of medications with patient who verbalized understanding   3. Reminded of outstanding routine lab work ordered by PCP  4. Patient to return in 4 months for follow-up but understands to call if there are any concerns or if there is a need to be seen sooner

## 2016-03-04 NOTE — Progress Notes
No teaching statement needed

## 2016-06-19 ENCOUNTER — Ambulatory Visit: Attending: Mental Health | Primary: Internal Medicine

## 2016-06-19 DIAGNOSIS — F251 Schizoaffective disorder, depressive type: Principal | ICD-10-CM

## 2016-06-19 MED ORDER — FLUPHENAZINE HCL 10 MG PO TABS
3 refills | Status: CP
Start: 2016-06-19 — End: 2016-11-06

## 2016-06-19 MED ORDER — TRAZODONE HCL 50 MG PO TABS
3 refills | Status: CP
Start: 2016-06-19 — End: 2016-10-30

## 2016-06-19 MED ORDER — FLUOXETINE HCL 40 MG PO CAPS
40 mg | Freq: Every day | ORAL | 3 refills | Status: CP
Start: 2016-06-19 — End: 2016-10-30

## 2016-06-19 MED ORDER — QUETIAPINE FUMARATE 400 MG PO TABS
3 refills | Status: CP
Start: 2016-06-19 — End: 2016-10-30

## 2016-06-19 NOTE — Progress Notes
Reason For Visit   Patient Name: Seth Chase  MRN: [id number]  Date of Birth:  12/19/1965     Service: Medication Management   Date of Service: 06/19/2016  Code: [id number]  Duration: 15 minutes   Begin Time: 9:05 AM End Time: 9:20 AM        Provider: Verne Spurrhristopher Calandro, ARNP / Williemae NatterJekeva Moss, NP Student  Location: [facility] Department of Psychiatry      Informants: Patient  Other Agencies Involved: None  Primary Language: English     Chief Complaint: "Schizoaffective Disorder"     Subjective   Seth Chase is a 51 y.o. male presenting for medication management of Schizoaffective Disorder.  This is a stable patient who has been on the same combination of medications without changes for many years now.  His symptoms have remained largely in remission for 10+ years.  He denies any audio or visual hallucinations. He reports spending time walking around the neighborhood, watching TV and doing chores around the house.  He reports sleeping 8 hours per night. Denies any issues related to appetite and eats one meal per day and snacks throughout the night, for which he has be doing for years. Denies any adverse effects from medications.  No changes since last visit, no complaints or concerns.        Objective   This is a 51 y.o. male appropriately dressed and groomed, cooperative with the interview. Speech was appropriate in rate and volume. Patient is alert and oriented to person, place and time. Affect was calm, friendly. Mood was congruent. Thought process is logical and coherent. There are no apparent delusions. Patient is denying current suicidal ideation or homicidal ideation, or hallucinations. Cognition is grossly intact, although not formally tested. Insight and judgment are fair. There are no abnormal involuntary movements noted on exam. Patient has normal muscle strength and tone and a steady gait. Psychomotor activity was normal. Suicidality risk was none. Violence risk was none. Thinking was future

## 2016-06-19 NOTE — Progress Notes
No teaching statement needed

## 2016-06-19 NOTE — Progress Notes
oriented. Mental attitude was positive.         ROS  All systems unremarkable except for Psychiatric and musculoskeletal: knee pain        Assessment   Axis I: 295.70 Schizoaffective Disorder, depressed type   Axis II: deferred   Axis III: none   Axis IV: chronic mental illness  Axis V: Global assessment of Functioning: 61-70      Plan:   1. Continue medications as previously prescribed: PROZAC 40 mg daily, SEROQUEL 400 mg at night, FLUPHENAZINE 10 mg one tablet in the morning and two tablets at night, and TRAZODONE 50 mg at night.   2. Reviewed risks, benefits, and potential adverse effects of medications with patient who verbalized understanding   3. Patient to return in 3 months for follow-up but understands to call if there are any concerns or if there is a need to be seen sooner

## 2016-08-10 ENCOUNTER — Ambulatory Visit: Attending: Psychiatry | Primary: Internal Medicine

## 2016-08-10 DIAGNOSIS — F251 Schizoaffective disorder, depressive type: Principal | ICD-10-CM

## 2016-08-10 NOTE — Patient Instructions
not go away or is severe.  This medicine may affect blood sugar levels. If you have diabetes, check with your doctor or health care professional before you change your diet or the dose of your diabetic medicine.  What side effects may I notice from receiving this medicine?  Side effects that you should report to your doctor or health care professional as soon as possible:  -allergic reactions like skin rash, itching or hives, swelling of the face, lips, or tongue  -anxious  -black, tarry stools  -breathing problems  -changes in vision  -confusion  -elevated mood, decreased need for sleep, racing thoughts, impulsive behavior  -eye pain  -fast, irregular heartbeat  -feeling faint or lightheaded, falls  -feeling agitated, angry, or irritable  -hallucination, loss of contact with reality  -loss of balance or coordination  -loss of memory  -painful or prolonged erections  -restlessness, pacing, inability to keep still  -seizures  -stiff muscles  -suicidal thoughts or other mood changes  -trouble sleeping  -unusual bleeding or bruising  -unusually weak or tired  -vomiting  Side effects that usually do not require medical attention (report to your doctor or health care professional if they continue or are bothersome):  -change in appetite or weight  -change in sex drive or performance  -diarrhea  -dry mouth  -headache  -increased sweating  -nausea  -tremors  This list may not describe all possible side effects. Call your doctor for medical advice about side effects. You may report side effects to FDA at 1-800-FDA-1088.  Where should I keep my medicine?  Keep out of the reach of children.  Store at room temperature between 15 and 30 degrees C (59 and 86 degrees F). Throw away any unused medicine after the expiration date.  NOTE: This sheet is a summary. It may not cover all possible information. If you have questions about this medicine, talk to your doctor, pharmacist, or health care provider.

## 2016-08-10 NOTE — Patient Instructions
-  fever or chills, sore throat  -fever with rash, swollen lymph nodes, or swelling of the face  -increased hunger or thirst  -increased urination  -problems with balance, talking, walking  -seizures  -stiff muscles  -suicidal thoughts or other mood changes  -uncontrollable head, mouth, neck, arm, or leg movements  -unusually weak or tired  Side effects that usually do not require medical attention (report to your doctor or health care professional if they continue or are bothersome):  -change in sex drive or performance  -constipation  -drowsy or dizzy  -dry mouth  -stomach upset  -weight gain  This list may not describe all possible side effects. Call your doctor for medical advice about side effects. You may report side effects to FDA at 1-800-FDA-1088.  Where should I keep my medicine?  Keep out of the reach of children.  Store at room temperature between 15 and 30 degrees C (59 and 86 degrees F). Throw away any unused medicine after the expiration date.  NOTE: This sheet is a summary. It may not cover all possible information. If you have questions about this medicine, talk to your doctor, pharmacist, or health care provider.  ? 2018 [employer]vier/Gold Standard (2014-10-30 13:07:35)        Fluphenazine tablets  What is this medicine?  FLUPHENAZINE (floo FEN a zeen) helps to treat disordered thoughts and some other emotional, nervous, and mental problems.  This medicine may be used for other purposes; ask your health care provider or pharmacist if you have questions.  COMMON BRAND NAME(S): Prolixin  What should I tell my health care provider before I take this medicine?  They need to know if you have any of these conditions:  -blood disorders or disease  -dementia  -head injury  -heart disease  -liver disease  -Parkinson's disease  -Reye's syndrome  -uncontrollable movement disorder  -an unusual or allergic reaction to fluphenazine, tartrazine dye, other medicines foods, dyes, or preservatives

## 2016-08-10 NOTE — Progress Notes
No signs of mania, hypomania, not observed responding to internal stimuli.  Denies SI/HI.  Is adherent with psychotropic medications with no adverse effects.      Psychiatric History   Inpatient psychiatric hospitalizations: 3x at [facility] in [place]; 9 years ago  Previous outpatient psychiatric treatment: [facility] in [place]  Prior therapy: previously  Prior psychiatric medication trials:  Cannot recall the names  Current psychiatric medication: see above  History of suicide attempts: hx of attempt 15-20 years ago  Self injurious behaviors: none reported.  History of violence or abuse: none reported.    Family History:    Mental illness in family: none reported.  Substance abuse in family: none reported.  Completed suicide in family: none reported.    Social History   Born in [place]; raised by mom and [employer]; 8 siblings (baby of the family).  Highest education level 8th grade.  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  Moved from [place] to [place] in 2015; resides with brother [name]) and his son 19yo Casimiro Needle).  1 gun in locked safe.  No legal issues.      Substance Abuse History   Tobacco: quit 2015; previously 1PPD for 3 years  Alcohol: none reported.  Cannabis: none reported.  Cocaine: none reported.  Opioids: none reported.  Benzodiazepines: none reported.  The patient denies the use of any other substance of abuse.   History of drug rehabilitation or detoxification: none reported.    Past Medical History:     Patient Active Problem List   Diagnosis   ? Schizoaffective disorder   ? BMI 29.0-29.9,adult   ? Special screening for malignant neoplasms, colon   ? History of colon polyps       Allergies:   Review of patient's allergies indicates no known allergies.    Current Home Medications:     Current Outpatient Medications    Medication Sig Start Date End Date Taking? Authorizing Provider

## 2016-08-10 NOTE — Patient Instructions
Increase Seroquel to  (1.5 tablets) for psychosis and sleep    Continue Fluphenazine  am and  pm for psychosis    Continue Prozac  for depression/mood    Continue Trazodone  for sleep           Quetiapine tablets  What is this medicine?  QUETIAPINE (kwe TYE a peen) is an antipsychotic. It is used to treat schizophrenia and bipolar disorder, also known as manic-depression.  This medicine may be used for other purposes; ask your health care provider or pharmacist if you have questions.  COMMON BRAND NAME(S): Seroquel  What should I tell my health care provider before I take this medicine?  They need to know if you have any of these conditions:  -brain tumor or head injury  -breast cancer  -cataracts  -diabetes  -difficulty swallowing  -heart disease  -kidney disease  -liver disease  -low blood counts, like low white cell, platelet, or red cell counts  -low blood pressure or dizziness when standing up  -Parkinson's disease  -previous heart attack  -seizures  -suicidal thoughts, plans, or attempt by you or a family member  -thyroid disease  -an unusual or allergic reaction to quetiapine, other medicines, foods, dyes, or preservatives  -pregnant or trying to get pregnant  -breast-feeding  How should I use this medicine?  Take this medicine by mouth. Swallow it with a drink of water. Follow the directions on the prescription label. If it upsets your stomach you can take it with food. Take your medicine at regular intervals. Do not take it more often than directed. Do not stop taking except on the advice of your doctor or health care professional.  A special MedGuide will be given to you by the pharmacist with each prescription and refill. Be sure to read this information carefully each time.  Talk to your pediatrician regarding the use of this medicine in children. While this drug may be prescribed for children as young as 10 years for selected conditions, precautions do apply.

## 2016-08-10 NOTE — Progress Notes
Teaching/Attestation Statement  I saw and evaluated the patient with the resident. I was present in the room for the key portions of the exam including the HPI and mental status exam. I discussed the treatment recommendations with the patient and answered additional questions. I reviewed the resident's note and agree with the assessment and plan. See resident's note for details.

## 2016-08-10 NOTE — Progress Notes
[facility]    Patient Name: Seth Chase  Patient DOB: Feb 19, 1966  Patient MRN: [id number]  Primary Language: Lenox Ponds    Provider: Zonia Kief, MD  Date of Service: 08/10/2016  Type of Service: Initial Assessment  CPT Code: 81191 - First Visit to NP/MD if initially done by therapist or high complexity med mgt. visit (lots of med changes, 45-60 min)   Insurance: Social research officer, government Complaint: "the voices are worsening"    History of Present Illness:   Seth Chase is a 51 y.o. AA male with past psychiatric history significant for Schizoaffective disorder, depressed type.  Pt and brother [name]) presents for initial psychiatric evaluation/transfer of care.  Was previously seeing Bennie Hind, ARNP since 10/2013.  Saw last provider 06/19/2016 and prescribed Prozac , Seroquel IR , Fluphenazine  am and  pm, and Trazodone .      Triston and brother [name]) report he has been stable and on current psychotropic medication regiment for many years.  Since last visit with Mr. Wells Guiles in Feb, psychosis and paranoia started worsening in March 2018.  Paranoia and auditory hallucinations of males voices to kill himself; will distract self by watch TV or tells brother and initial and middle insomnia (total hours of sleeps 2-3; previously 6 or 7 hours).  Denies recent stressors/triggers/changes. Voices first began 15-20 years ago and was dx with Schizophrenia; has been on same psychotropic regiment for many years.      Rates depression 10/10; worsened by the voices; goes to church.  Endorses anhedonia, amotivation, initial and middle insomnia. Fair appetite (eats one big meal during the day and snacks throughout the day).  Typical day watches TV, clean up the house, go outside, church every Sunday.  Has a case manager who came by the house last week.  Hx of suicide attempt 15-20 years ago; has passive SI thoughts with no intent or plan; distract self.

## 2016-08-10 NOTE — Progress Notes
abnormal movements noted.  Musculoskeletal: ambulates without difficulty  Speech/Language: fluent with normal rate, volume, and tone.  Mood: depressed.  Affect: congruent, appropriate for content, stable, normal range, euthymic.  Thought process: linear, coherent, goal directed, with no loose associations.    Thought content: paranoid ideations present.  Orientation: person, place, situation, date, day of week, month, year.  Memory: remote and recent memory grossly intact.  Attention/Concentration: average.  Fund of knowledge: average.  Judgment: fair.  Insight: fair.    DATA:  PHQ-9 total score: 14 (score 10-14: moderate depression; significant for "more than half the days": depressed; insomnia; low energy; feel bad about self; poor concentration)    Diagnosis:   Axis I: Schizoaffective disorder; depressed type   Axis II: Deferred   Axis III: none   Axis IV: poor coping skills and chronic mental illness   Axis V: 41-50    Prognosis: Guarded for recovery.    Plan:   Seth Chase will receive medication management at the [facility].    Medication Recommendations:   - Increase Seroquel from  to  to target residual psychosis/paranoia and insomnia  - Continue Fluphenazine  am and  pm to target psychosis  - Continue Fluoxetine  daily for depression  - Continue Trazodone  prn for insomnia  - Went over Safety plan with name and numbers for people to contact and provide emergency crisis resource handout  -Medication side effect profiles, risks, and benefits were discussed with the patient.  - Patient encouraged to contact the clinic if experiencing any adverse reactions with medications.      Other Recommendations:   - If patient has any concerns for their own safety, due to adverse reactions to medications, suicidal or homicidal ideations, auditory or visual hallucinations, or delusions, they have been told to call 911 or go to the nearest emergency department.

## 2016-08-10 NOTE — Patient Instructions
?   2018 Elsevier/Gold Standard (2015-09-28 15:55:27)          Trazodone tablets  What is this medicine?  TRAZODONE (TRAZ oh done) is used to treat depression.  This medicine may be used for other purposes; ask your health care provider or pharmacist if you have questions.  COMMON BRAND NAME(S): Desyrel  What should I tell my health care provider before I take this medicine?  They need to know if you have any of these conditions:  -attempted suicide or thinking about it  -bipolar disorder  -bleeding problems  -glaucoma  -heart disease, or previous heart attack  -irregular heart beat  -kidney or liver disease  -low levels of sodium in the blood  -an unusual or allergic reaction to trazodone, other medicines, foods, dyes or preservatives  -pregnant or trying to get pregnant  -breast-feeding  How should I use this medicine?  Take this medicine by mouth with a glass of water. Follow the directions on the prescription label. Take this medicine shortly after a meal or a light snack. Take your medicine at regular intervals. Do not take your medicine more often than directed. Do not stop taking this medicine suddenly except upon the advice of your doctor. Stopping this medicine too quickly may cause serious side effects or your condition may worsen.  A special MedGuide will be given to you by the pharmacist with each prescription and refill. Be sure to read this information carefully each time.  Talk to your pediatrician regarding the use of this medicine in children. Special care may be needed.  Overdosage: If you think you have taken too much of this medicine contact a poison control center or emergency room at once.  NOTE: This medicine is only for you. Do not share this medicine with others.  What if I miss a dose?  If you miss a dose, take it as soon as you can. If it is almost time for your next dose, take only that dose. Do not take double or extra doses.  What may interact with this medicine?

## 2016-08-10 NOTE — Patient Instructions
dyes, or preservatives  -pregnant or trying to get pregnant  -breast-feeding  How should I use this medicine?  Take this medicine by mouth with a glass of water. Follow the directions on the prescription label. You can take this medicine with or without food. Take your medicine at regular intervals. Do not take it more often than directed. Do not stop taking this medicine suddenly except upon the advice of your doctor. Stopping this medicine too quickly may cause serious side effects or your condition may worsen.  A special MedGuide will be given to you by the pharmacist with each prescription and refill. Be sure to read this information carefully each time.  Talk to your pediatrician regarding the use of this medicine in children. While this drug may be prescribed for children as young as 7 years for selected conditions, precautions do apply.  Overdosage: If you think you have taken too much of this medicine contact a poison control center or emergency room at once.  NOTE: This medicine is only for you. Do not share this medicine with others.  What if I miss a dose?  If you miss a dose, skip the missed dose and go back to your regular dosing schedule. Do not take double or extra doses.  What may interact with this medicine?  Do not take this medicine with any of the following medications:  -other medicines containing fluoxetine, like Sarafem or Symbyax  -cisapride  -linezolid  -MAOIs like Carbex, Eldepryl, Marplan, Nardil, and Parnate  -methylene blue (injected into a vein)  -pimozide  -thioridazine  This medicine may also interact with the following medications:  -alcohol  -amphetamines  -aspirin and aspirin-like medicines  -carbamazepine  -certain medicines for depression, anxiety, or psychotic disturbances  -certain medicines for migraine headaches like almotriptan, eletriptan, frovatriptan, naratriptan, rizatriptan, sumatriptan, zolmitriptan  -digoxin  -diuretics  -fentanyl  -flecainide  -furazolidone

## 2016-08-10 NOTE — Progress Notes
RTC: 2-3 weeks     60 minutes were spent with the patient for psychoeducation, medication management, and supportive counseling.  Patient participated in discussion, asked appropriate questions, and agreed to the plan discussed above.    Patient discussed with attending physician, Dr. Ladona Ridgel, who was present for key portions of exam and agrees with the above assessment and plan.    Zonia Kief, MD, PGY-3  08/10/2016 9:04 AM  [facility]

## 2016-08-10 NOTE — Progress Notes
Cyanocobalamin (VITAMIN B 12 PO) Take 1 tablet by mouth daily.    Information, Historical   FLUoxetine (PROzac) 40 MG PO Capsule Take 1 capsule by mouth daily. 06/19/16   Verne Spurr, ARNP   fluPHENAZine (PROLIXIN) 10 MG PO Tablet Take one tablet in the morning and two tablets at night 06/19/16   Calandro, Christopher, ARNP   GARLIC PO Take 1 tablet by mouth daily.    Information, Historical   ibuprofen (ADVIL,MOTRIN) 600 MG Tablet TAKE 1 TABLET BY MOUTH EVERY 8 HOURS AS NEEDED FOR PAIN 11/05/15   Wallis Mart, MD   PEG-3350 and electrolytes (GoLYTELY) 236 G Solution Reconstituted Use as directed 10/24/15   Caryl Asp, PA-C   QUEtiapine (SEROquel) 400 MG PO Tablet TAKE 1 TABLET BY MOUTH EVERY NIGHT AT BEDTIME 06/19/16   Verne Spurr, ARNP   traZODone (DESYREL) 50 MG PO Tablet TAKE 1 TABLET BY MOUTH EVERY NIGHT AT BEDTIME 06/19/16   Verne Spurr, ARNP   Vitamin D 2000 UNITS Capsule Take 1 caplet by mouth daily.    Information, Historical     Review of systems:    Constitutional: denies significant weight loss/gain, fatigue  HEENT: denies rhinorrhea, sore throat.  Respiratory: denies cough, shortness of breath  Cardiovascular: denies chest pain  GI: denies N/V/C/D, abdominal pain.  MSK: denies joint pain, muscle stiffness/soreness, back pain, neck pain.  Neuro: denies HA, dizziness.  Psychiatric: as above and below.    Vital Signs:    BP 149/89 - Pulse 87 - Temp 37.2 ?C (98.9 ?F) - Resp 18 - Ht 1.829 m (6') - Wt 97.3 kg (214 lb 9.6 oz) - BMI 29.1 kg/m2    Lab Results:      Lab Results   Component Value Date    TRIG 107 04/14/2016    HDL 43 04/14/2016    LDL 135 (H) 04/14/2016    CHOL 199 04/14/2016    HGBA1C 5.0 04/14/2016     Mental Status Examination:   General/Appearance: 51 y.o. African American male who appears stated age.  Behavior: cooperative, engaged.    Eye Contact: appropriate.  Psychomotor: without apparent psychomotor agitation or retardation, no

## 2016-08-10 NOTE — Patient Instructions
-  pregnant or trying to get pregnant  -breast-feeding  How should I use this medicine?  Take this medicine by mouth with a glass of water. Follow the directions on the prescription label. Take your doses at regular intervals. Do not take your medicine more often than directed. Do not stop taking this medicine except on the advice of your doctor or health care professional.  Talk to your pediatrician regarding the use of this medicine in children. Special care may be needed.  Overdosage: If you think you have taken too much of this medicine contact a poison control center or emergency room at once.  NOTE: This medicine is only for you. Do not share this medicine with others.  What if I miss a dose?  If you miss a dose, take it as soon as you can. If it is almost time for your next dose, take only that dose. Do not take double or extra doses.  What may interact with this medicine?  Do not take this medicine with any of the following medications:  -amoxapine  -certain antibiotics like gatifloxacin and sparfloxacin  -cisapride  -clozapine  -dofetilide  -droperidol  -ephedrine  -certain medicines for mental depression like escitalopram, fluoxetine, paroxetine, sertraline  -ibutilide  -levomethadyl  -maprotiline  -other phenothiazines like chlorpromazine, mesoridazine, prochlorperazine, and thioridazine  -pimozide  -pindolol  -propranolol  -risperidone  -sotalol  -trimethobenzamide  -ziprasidone  This medicine may also interact with the following medications:  -atropine  -some medications for high blood pressure or heart problems  This list may not describe all possible interactions. Give your health care provider a list of all the medicines, herbs, non-prescription drugs, or dietary supplements you use. Also tell them if you smoke, drink alcohol, or use illegal drugs. Some items may interact with your medicine.  What should I watch for while using this medicine?

## 2016-08-10 NOTE — Patient Instructions
Do not take this medicine with any of the following medications:  -certain medicines for fungal infections like fluconazole, itraconazole, ketoconazole, posaconazole, voriconazole  -cisapride  -dofetilide  -dronedarone  -linezolid  -MAOIs like Carbex, Eldepryl, Marplan, Nardil, and Parnate  -mesoridazine  -methylene blue (injected into a vein)  -pimozide  -saquinavir  -thioridazine  -ziprasidone  This medicine may also interact with the following medications:  -alcohol  -antiviral medicines for HIV or AIDS  -aspirin and aspirin-like medicines  -barbiturates like phenobarbital  -certain medicines for blood pressure, heart disease, irregular heart beat  -certain medicines for depression, anxiety, or psychotic disturbances  -certain medicines for migraine headache like almotriptan, eletriptan, frovatriptan, naratriptan, rizatriptan, sumatriptan, zolmitriptan  -certain medicines for seizures like carbamazepine and phenytoin  -certain medicines for sleep  -certain medicines that treat or prevent blood clots like dalteparin, enoxaparin, warfarin  -digoxin  -fentanyl  -lithium  -NSAIDS, medicines for pain and inflammation, like ibuprofen or naproxen  -other medicines that prolong the QT interval (cause an abnormal heart rhythm)  -rasagiline  -supplements like St. John's wort, kava kava, valerian  -tramadol  -tryptophan  This list may not describe all possible interactions. Give your health care provider a list of all the medicines, herbs, non-prescription drugs, or dietary supplements you use. Also tell them if you smoke, drink alcohol, or use illegal drugs. Some items may interact with your medicine.  What should I watch for while using this medicine?  Tell your doctor if your symptoms do not get better or if they get worse. Visit your doctor or health care professional for regular checks on your progress. Because it may take several weeks to see the full effects of

## 2016-08-10 NOTE — Patient Instructions
-  isoniazid  -lithium  -medicines for sleep  -medicines that treat or prevent blood clots like warfarin, enoxaparin, and dalteparin  -NSAIDs, medicines for pain and inflammation, like ibuprofen or naproxen  -phenytoin  -procarbazine  -propafenone  -rasagiline  -ritonavir  -supplements like St. John's wort, kava kava, valerian  -tramadol  -tryptophan  -vinblastine  This list may not describe all possible interactions. Give your health care provider a list of all the medicines, herbs, non-prescription drugs, or dietary supplements you use. Also tell them if you smoke, drink alcohol, or use illegal drugs. Some items may interact with your medicine.  What should I watch for while using this medicine?  Tell your doctor if your symptoms do not get better or if they get worse. Visit your doctor or health care professional for regular checks on your progress. Because it may take several weeks to see the full effects of this medicine, it is important to continue your treatment as prescribed by your doctor.  Patients and their families should watch out for new or worsening thoughts of suicide or depression. Also watch out for sudden changes in feelings such as feeling anxious, agitated, panicky, irritable, hostile, aggressive, impulsive, severely restless, overly excited and hyperactive, or not being able to sleep. If this happens, especially at the beginning of treatment or after a change in dose, call your health care professional.  Seth Chase may get drowsy or dizzy. Do not drive, use machinery, or do anything that needs mental alertness until you know how this medicine affects you. Do not stand or sit up quickly, especially if you are an older patient. This reduces the risk of dizzy or fainting spells. Alcohol may interfere with the effect of this medicine. Avoid alcoholic drinks.  Your mouth may get dry. Chewing sugarless gum or sucking hard candy, and drinking plenty of water may help. Contact your doctor if the problem does

## 2016-08-10 NOTE — Patient Instructions
medicine.  Your health care provider may suggest that you have your eyes examined prior to starting this medicine, and every 6 months thereafter.  If you have been taking this medicine regularly for some time, do not suddenly stop taking it. You must gradually reduce the dose or your symptoms may get worse. Ask your doctor or health care professional for advice.  Patients and their families should watch out for worsening depression or thoughts of suicide. Also watch out for sudden or severe changes in feelings such as feeling anxious, agitated, panicky, irritable, hostile, aggressive, impulsive, severely restless, overly excited and hyperactive, or not being able to sleep. If this happens, especially at the beginning of antidepressant treatment or after a change in dose, call your health care professional.  Bonita Quin may get dizzy or drowsy. Do not drive, use machinery, or do anything that needs mental alertness until you know how this medicine affects you. Do not stand or sit up quickly, especially if you are an older patient. This reduces the risk of dizzy or fainting spells. Alcohol can increase dizziness and drowsiness. Avoid alcoholic drinks.  Do not treat yourself for colds, diarrhea or allergies. Ask your doctor or health care professional for advice, some ingredients may increase possible side effects.  This medicine can reduce the response of your body to heat or cold. Dress warm in cold weather and stay hydrated in hot weather. If possible, avoid extreme temperatures like saunas, hot tubs, very hot or cold showers, or activities that can cause dehydration such as vigorous exercise.  What side effects may I notice from receiving this medicine?  Side effects that you should report to your doctor or health care professional as soon as possible:  -allergic reactions like skin rash, itching or hives, swelling of the face, lips, or tongue  -difficulty swallowing  -fast or irregular heartbeat

## 2016-08-10 NOTE — Patient Instructions
this medicine, it is important to continue your treatment as prescribed by your doctor.  Patients and their families should watch out for new or worsening thoughts of suicide or depression. Also watch out for sudden changes in feelings such as feeling anxious, agitated, panicky, irritable, hostile, aggressive, impulsive, severely restless, overly excited and hyperactive, or not being able to sleep. If this happens, especially at the beginning of treatment or after a change in dose, call your health care professional.  Seth Chase may get drowsy or dizzy. Do not drive, use machinery, or do anything that needs mental alertness until you know how this medicine affects you. Do not stand or sit up quickly, especially if you are an older patient. This reduces the risk of dizzy or fainting spells. Alcohol may interfere with the effect of this medicine. Avoid alcoholic drinks.  This medicine may cause dry eyes and blurred vision. If you wear contact lenses you may feel some discomfort. Lubricating drops may help. See your eye doctor if the problem does not go away or is severe.  Your mouth may get dry. Chewing sugarless gum, sucking hard candy and drinking plenty of water may help. Contact your doctor if the problem does not go away or is severe.  What side effects may I notice from receiving this medicine?  Side effects that you should report to your doctor or health care professional as soon as possible:  -allergic reactions like skin rash, itching or hives, swelling of the face, lips, or tongue  -elevated mood, decreased need for sleep, racing thoughts, impulsive behavior  -confusion  -fast, irregular heartbeat  -feeling faint or lightheaded, falls  -feeling agitated, angry, or irritable  -loss of balance or coordination  -painful or prolonged erections  -restlessness, pacing, inability to keep still  -suicidal thoughts or other mood changes  -tremors  -trouble sleeping  -seizures  -unusual bleeding or bruising

## 2016-08-10 NOTE — Patient Instructions
Visit your doctor or health care professional for regular checks on your progress.  You may get drowsy, dizzy, or have blurred vision. Do not drive, use machinery, or do anything that needs mental alertness until you know how this medicine affects you. Do not stand or sit up quickly, especially if you are an older patient. This reduces the risk of dizzy or fainting spells. Alcohol can increase possible dizziness or drowsiness. Avoid alcoholic drinks.  This medicine can reduce the response of your body to heat or cold. Dress warm in cold weather and stay hydrated in hot weather. If possible, avoid extreme temperatures like saunas, hot tubs, very hot or cold showers, or activities that can cause dehydration such as vigorous exercise.  This medicine can make you more sensitive to the sun. Keep out of the sun. If you cannot avoid being in the sun, wear protective clothing and use sunscreen. Do not use sun lamps or tanning beds/booths.  Your mouth may get dry. Chewing sugarless gum or sucking hard candy, and drinking plenty of water may help. Contact your doctor if the problem does not go away or is severe.  What side effects may I notice from receiving this medicine?  Side effects that you should report to your doctor or health care professional as soon as possible:  -allergic reactions like skin rash, itching or hives, swelling of the face, lips, or tongue  -blurred vision  -breast enlargement in men or women  -breast milk in women who are not breast-feeding  -chest pain, fast or irregular heartbeat  -confusion, restlessness  -dark yellow or brown urine  -difficulty breathing or swallowing  -dizziness or fainting spells  -drooling, shaking  -fever, chills, sore throat  -involuntary or uncontrollable movements of the eyes, mouth, head, arms, and legs  -seizures  -stomach area pain  -unusual bleeding or bruising  -unusually weak or tired  -yellowing of skin or eyes

## 2016-08-10 NOTE — Patient Instructions
Side effects that usually do not require medical attention (report to your doctor or health care professional if they continue or are bothersome):  -difficulty sleeping  -difficulty passing urine  -headache  -sexual dysfunction  This list may not describe all possible side effects. Call your doctor for medical advice about side effects. You may report side effects to FDA at 1-800-FDA-1088.  Where should I keep my medicine?  Keep out of the reach of children.  Store at room temperature between 15 and 30 degrees C (59 and 86 degrees F). Protect from light. Throw away any unused medicine after the expiration date.  NOTE: This sheet is a summary. It may not cover all possible information. If you have questions about this medicine, talk to your doctor, pharmacist, or health care provider.  ? 2018 Elsevier/Gold Standard (2015-04-01 21:01:33)        Fluoxetine capsules or tablets (Depression/Mood Disorders)  What is this medicine?  FLUOXETINE (floo OX e teen) belongs to a class of drugs known as selective serotonin reuptake inhibitors (SSRIs). It helps to treat mood problems such as depression, obsessive compulsive disorder, and panic attacks. It can also treat certain eating disorders.  This medicine may be used for other purposes; ask your health care provider or pharmacist if you have questions.  COMMON BRAND NAME(S): Prozac  What should I tell my health care provider before I take this medicine?  They need to know if you have any of these conditions:  -bipolar disorder or a family history of bipolar disorder  -bleeding disorders  -glaucoma  -heart disease  -liver disease  -low levels of sodium in the blood  -seizures  -suicidal thoughts, plans, or attempt; a previous suicide attempt by you or a family member  -take MAOIs like Carbex, Eldepryl, Marplan, Nardil, and Parnate  -take medicines that treat or prevent blood clots  -thyroid disease  -an unusual or allergic reaction to fluoxetine, other medicines, foods,

## 2016-08-10 NOTE — Patient Instructions
Side effects that usually do not require medical attention (report to your doctor or health care professional if they continue or are bothersome):  -change in sex drive or performance  -change in appetite or weight  -constipation  -headache  -muscle aches or pains  -nausea  This list may not describe all possible side effects. Call your doctor for medical advice about side effects. You may report side effects to FDA at 1-800-FDA-1088.  Where should I keep my medicine?  Keep out of the reach of children.  Store at room temperature between 15 and 30 degrees C (59 to 86 degrees F). Protect from light. Keep container tightly closed. Throw away any unused medicine after the expiration date.  NOTE: This sheet is a summary. It may not cover all possible information. If you have questions about this medicine, talk to your doctor, pharmacist, or health care provider.  ? 2018 Elsevier/Gold Standard (2015-09-26 16:57:05)

## 2016-08-10 NOTE — Patient Instructions
Patients over age 53 years may have a stronger reaction to this medicine and need smaller doses.  Overdosage: If you think you have taken too much of this medicine contact a poison control center or emergency room at once.  NOTE: This medicine is only for you. Do not share this medicine with others.  What if I miss a dose?  If you miss a dose, take it as soon as you can. If it is almost time for your next dose, take only that dose. Do not take double or extra doses.  What may interact with this medicine?  Do not take this medicine with any of the following medications:  -certain medicines for fungal infections like fluconazole, itraconazole, ketoconazole, posaconazole, voriconazole  -cisapride  -dofetilide  -dronedarone  -droperidol  -grepafloxacin  -halofantrine  -phenothiazines like chlorpromazine, mesoridazine, thioridazine  -pimozide  -sparfloxacin  -ziprasidone  This medicine may also interact with the following medications:  -alcohol  -antiviral medicines for HIV or AIDS  -certain medicines for blood pressure  -certain medicines for depression, anxiety, or psychotic disturbances like haloperidol, lorazepam  -certain medicines for diabetes  -certain medicines for Parkinson's disease  -certain medicines for seizures like carbamazepine, phenobarbital, phenytoin  -cimetidine  -erythromycin  -other medicines that prolong the QT interval (cause an abnormal heart rhythm)  -rifampin  -steroid medicines like prednisone or cortisone  This list may not describe all possible interactions. Give your health care provider a list of all the medicines, herbs, non-prescription drugs, or dietary supplements you use. Also tell them if you smoke, drink alcohol, or use illegal drugs. Some items may interact with your medicine.  What should I watch for while using this medicine?  Visit your doctor or health care professional for regular checks on your progress. It may be several weeks before you see the full effects of this

## 2016-09-02 ENCOUNTER — Ambulatory Visit: Attending: Psychiatry | Primary: Internal Medicine

## 2016-09-02 DIAGNOSIS — F251 Schizoaffective disorder, depressive type: Principal | ICD-10-CM

## 2016-09-02 NOTE — Progress Notes
Family History:    No Mental illness in family or Substance abuse in family    Social History   Born in [place]; raised by mom and [employer]; 8 siblings (baby of the family).  Highest education level 8th grade.  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  Moved from [place] to [place] in 2015; resides with brother [name]) and his son 19yo Casimiro Needle).  1 gun in locked safe.  No legal issues.      Substance Abuse History   Tobacco: quit 2015; previously 1PPD for 3 years  No alcohol or drug abuse reported.    Past Medical History:     Patient Active Problem List   Diagnosis   ? Schizoaffective disorder   ? BMI 29.0-29.9,adult   ? Special screening for malignant neoplasms, colon   ? History of colon polyps     Allergies:   Review of patient's allergies indicates no known allergies.    Current Home Medications:     Current Outpatient Medications    Medication Sig Start Date End Date Taking? Authorizing Provider   Cyanocobalamin (VITAMIN B 12 PO) Take 1 tablet by mouth daily.    Information, Historical   FLUoxetine (PROzac) 40 MG PO Capsule Take 1 capsule by mouth daily. 06/19/16   Verne Spurr, ARNP   fluPHENAZine (PROLIXIN) 10 MG PO Tablet Take one tablet in the morning and two tablets at night 06/19/16   Calandro, Christopher, ARNP   GARLIC PO Take 1 tablet by mouth daily.    Information, Historical   ibuprofen (ADVIL,MOTRIN) 600 MG Tablet TAKE 1 TABLET BY MOUTH EVERY 8 HOURS AS NEEDED FOR PAIN 11/05/15   Wallis Mart, MD   PEG-3350 and electrolytes (GoLYTELY) 236 G Solution Reconstituted Use as directed 10/24/15   Caryl Asp, PA-C   QUEtiapine (SEROquel) 400 MG PO Tablet TAKE 1 TABLET BY MOUTH EVERY NIGHT AT BEDTIME 06/19/16   Verne Spurr, ARNP   traZODone (DESYREL) 50 MG PO Tablet TAKE 1 TABLET BY MOUTH EVERY NIGHT AT BEDTIME 06/19/16   Verne Spurr, ARNP

## 2016-09-02 NOTE — Progress Notes
Vitamin D 2000 UNITS Capsule Take 1 caplet by mouth daily.    Information, Historical     Review of systems:    Constitutional: denies significant weight loss/gain, fatigue  HEENT: denies rhinorrhea, sore throat.  Respiratory: denies cough, shortness of breath  Cardiovascular: denies chest pain  GI: denies N/V/C/D, abdominal pain.  MSK: denies joint pain, muscle stiffness/soreness, back pain, neck pain.  Neuro: denies HA, dizziness.  Psychiatric: as above and below.    Vital Signs:    BP (!) 139/92 - Pulse 105 - Temp 36.8 ?C (98.2 ?F) - Resp 18 - Ht 1.829 m (6') - Wt 96.5 kg (212 lb 12.8 oz) - BMI 28.86 kg/m2     Last Wt, Ht, and BMI    Wt Readings from Last 3 Encounters:   09/02/16 96.5 kg (212 lb 12.8 oz)   08/10/16 97.3 kg (214 lb 9.6 oz)   06/19/16 100.2 kg (221 lb)     Ht Readings from Last 3 Encounters:   09/02/16 1.829 m (6')   08/10/16 1.829 m (6')   06/19/16 1.829 m (6')     Facility age limit for growth percentiles is 20 years.    Lab Results:      Lab Results   Component Value Date    TRIG 107 04/14/2016    HDL 43 04/14/2016    LDL 135 (H) 04/14/2016    CHOL 199 04/14/2016    HGBA1C 5.0 04/14/2016     Mental Status Examination:   General/Appearance: 51 y.o. African American male who appears stated age. Blue polo tshirt, Transport planner  Behavior: cooperative, engaged.    Eye Contact: appropriate.  Psychomotor: without apparent psychomotor agitation or retardation, no abnormal movements noted.  Musculoskeletal: ambulates without difficulty  Speech/Language: fluent with normal rate, volume, and tone.  Mood: depressed.  Affect: congruent, appropriate for content, stable, normal range, euthymic.  Thought process: linear, coherent, goal directed, with no loose associations.    Thought content: some paranoid ideations present.  Orientation: person, place, situation, date, day of week, month, year.  Memory: remote and recent memory grossly intact.  Attention/Concentration: average.  Fund of knowledge: average.

## 2016-09-02 NOTE — Progress Notes
Judgment: fair.  Insight: fair.    DATA: from initial visit  PHQ-9 total score: 14 (score 10-14: moderate depression; significant for "more than half the days": depressed; insomnia; low energy; feel bad about self; poor concentration)    Diagnosis:   Axis I: Schizoaffective disorder; depressed type   Axis II: Deferred   Axis III: none   Axis IV: poor coping skills and chronic mental illness   Axis V: 41-50    Prognosis: Guarded for recovery.    Plan:   Seth Chase will receive medication management at the [facility].    Medication Recommendations: (did not need refills for today's visit)  - Continue Seroquel  to target residual psychosis/paranoia and insomnia  - Continue Fluphenazine  am and  pm to target psychosis   - Discussed risks vs benefit of being on two antipsychotics; and goal to be on just one antipsychotic to control his symptoms.   - Continue Fluoxetine  daily for depression  - Continue Trazodone  prn for insomnia  - Went over Safety plan with name and numbers for people to contact and provide emergency crisis resource handout  -Medication side effect profiles, risks, and benefits were discussed with the patient.  - Patient encouraged to contact the clinic if experiencing any adverse reactions with medications.      Other Recommendations:   - If patient has any concerns for their own safety, due to adverse reactions to medications, suicidal or homicidal ideations, auditory or visual hallucinations, or delusions, they have been told to call 911 or go to the nearest emergency department.    RTC: 3-4 weeks     30 minutes were spent with the patient for psychoeducation, medication management, and supportive counseling.  Patient participated in discussion, asked appropriate questions, and agreed to the plan discussed above.    Patient discussed with attending physician, Dr. Ladona Ridgel, who was present

## 2016-09-02 NOTE — Progress Notes
[facility]    Patient Name: Seth Chase  Patient DOB: 1966/04/24  Patient MRN: [id number]  Primary Language: Lenox Ponds    Provider: Zonia Kief, MD  Date of Service: 09/02/2016  Type of Service: follow up/med management  CPT Code: 54098 - Moderate complexity med. mgt. visit (some med changes, 30 min)   Insurance: [employer] Complaint: "my sleep has improved "    Interval History:   Travares Nelles is a 51 y.o. AA male with past psychiatric history significant for Schizoaffective disorder, depressed type.  Pt and brother [name]) presents for initial psychiatric evaluation/transfer of care.  Was previously seeing Bennie Hind, ARNP since 10/2013.  Last saw this provider on 08/10/2016 and increased Seroquel to  and continued Prozac , Fluphenazine  am and  pm, and Trazodone .      Since last visit overall doing well with no acute complaints.  Sleep has improved to 6 hours of total sleep time.  Continues to see little green men throughout the day, but less.  Ignores it by distracting self or putting headphones on.  Started re-seeing the green men 06/2016.  Didn't see them for 6 months to 1 year in 2017.  Doesn't like to be in crowded areas.  Brother adopted a Nurse, mental health ([name]) last weekend. Typical day- clean, watches tv, walks the dog, involved in church.      No signs of mania, hypomania, not observed responding to internal stimuli.  Denies SI/HI.  Is adherent with psychotropic medications with no adverse effects.      Psychiatric History   Inpatient psychiatric hospitalizations: 3x at [facility] in [place]; 9 years ago  Previous outpatient psychiatric treatment: [facility] in [place]  Prior therapy: none  Prior psychiatric medication trials:  Cannot recall the names  Current psychiatric medication: see above  History of suicide attempts: hx of attempt 15-20 years ago  Self injurious behaviors: none reported.  History of violence or abuse: none reported.

## 2016-09-02 NOTE — Patient Instructions
-   Continue Seroquel  to target residual psychosis/paranoia and insomnia  - Continue Fluphenazine  am and  pm to target psychosis  - Continue Fluoxetine  daily for depression  - Continue Trazodone  prn for insomnia

## 2016-09-02 NOTE — Progress Notes
for key portions of exam and agrees with the above assessment and plan.    Zonia Kief, MD, PGY-3  09/02/2016 3:44 PM  [facility]

## 2016-09-04 NOTE — Progress Notes
Teaching/Attestation Statement  I saw and evaluated the patient with the resident. I was present in the room for the key portions of the exam including the HPI and mental status exam. I discussed the treatment recommendations with the patient and answered additional questions. I reviewed the resident's note and agree with the assessment and plan. See resident's note for details.

## 2016-09-11 ENCOUNTER — Encounter: Attending: Mental Health | Primary: Internal Medicine

## 2016-09-18 ENCOUNTER — Encounter: Attending: Psychiatry | Primary: Internal Medicine

## 2016-09-25 ENCOUNTER — Ambulatory Visit: Attending: Psychiatry | Primary: Internal Medicine

## 2016-09-25 DIAGNOSIS — F251 Schizoaffective disorder, depressive type: Principal | ICD-10-CM

## 2016-09-25 NOTE — Progress Notes
Vitamin D 2000 UNITS Capsule Take 1 caplet by mouth daily.    Information, Historical     Review of systems:    Constitutional: denies significant weight loss/gain, fatigue  HEENT: denies rhinorrhea, sore throat.  Respiratory: denies cough, shortness of breath  Cardiovascular: denies chest pain  GI: denies N/V/C/D, abdominal pain.  MSK: denies joint pain, muscle stiffness/soreness, back pain, neck pain.  Neuro: denies HA, dizziness.  Psychiatric: as above and below.    Vital Signs:    BP (!) 151/96  - Pulse 94  - Temp 37.3 ?C (99.2 ?F)  - Resp 18  - Ht 1.829 m (6')  - Wt 98.9 kg (218 lb)  - BMI 29.57 kg/m?      Last Wt, Ht, and BMI    Wt Readings from Last 3 Encounters:   09/25/16 98.9 kg (218 lb)   09/02/16 96.5 kg (212 lb 12.8 oz)   08/10/16 97.3 kg (214 lb 9.6 oz)     Ht Readings from Last 3 Encounters:   09/25/16 1.829 m (6')   09/02/16 1.829 m (6')   08/10/16 1.829 m (6')     Facility age limit for growth percentiles is 20 years.    Lab Results:      Lab Results   Component Value Date    TRIG 107 04/14/2016    HDL 43 04/14/2016    LDL 135 (H) 04/14/2016    CHOL 199 04/14/2016    HGBA1C 5.0 04/14/2016     Mental Status Examination:   General/Appearance: 51 y.o. African American male who appears stated age, casually dressed.    Behavior: cooperative, engaged, pleasant, friendly.   Eye Contact: appropriate.  Psychomotor: without apparent psychomotor agitation or retardation, no abnormal movements noted.  Musculoskeletal: ambulates without difficulty  Speech/Language: fluent with normal rate, volume, and monotone.  Mood: LESS depressed.  Affect: congruent, appropriate for content, stable, normal range, euthymic.  Thought process: linear, coherent, goal directed, with no loose associations.    Thought content: some paranoid ideations present.  Orientation: person, place, situation, date, day of week, month, year.  Memory: remote and recent memory grossly intact.  Attention/Concentration: average.

## 2016-09-25 NOTE — Progress Notes
[facility]    Patient Name: Seth Chase  Patient DOB: 02/21/1966  Patient MRN: [id number]  Primary Language: Lenox PondsEnglish    Provider: Zonia KiefKitty Leung, MD  Date of Service: 09/25/2016  Type of Service: follow up/med management  CPT Code: 5409899214 - Moderate complexity med. mgt. visit (some med changes, 30 min)   Insurance: Medicare [employer]    Chief Complaint: "I am walking the dog ([name]). "    Interval History:   Seth Chase is a 51 y.o. AA male who resides with brother and nephew in [place]. Has past psychiatric history significant for Schizoaffective disorder, depressed type.  Pt presents for follow up/med management.  Was previously seeing Bennie Hindhris Calandro, ARNP since 10/2013.  Last saw this provider on 09/02/2016 and increased Seroquel to 600mg  and continued Prozac 40mg , Fluphenazine 10mg  am and 20mg  pm, and Trazodone 50mg .      Since last visit overall doing well with no acute complaints.  No issues with sleep (5-6 hours with Trazodone) and appetite.  Paranoia improving.  Endorses auditory hallucinations of male voices to kill himself or others.  Distracts self, goes for a walk, listens to music,or calls his brother.  Heard less voices 6 months ago and recently has slightly worsened.  No issues at home, things going well with family. [place] to visit family.  Last time he visited them was 3 years ago.      No signs of mania, hypomania, not observed responding to internal stimuli.  Denies SI/HI.  Is adherent with psychotropic medications with no adverse effects.      Psychiatric History   Inpatient psychiatric hospitalizations: [facility]x [facility] in [place]; 9 years ago  Previous outpatient psychiatric treatment: [facility] in [place]  Prior therapy: none  Prior psychiatric medication trials:  Cannot recall the names  Current psychiatric medication: see above  History of suicide attempts: hx of attempt 15-20 years ago

## 2016-09-25 NOTE — Progress Notes
Self injurious behaviors: none reported.  History of violence or abuse: none reported.    Family History:    No Mental illness in family or Substance abuse in family    Social History   [place]; raised by mom and [employer]; 8 siblings (baby of the family).  Highest education level 8th grade.  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  [place]; resides with brother [name] and his son 19yo [name].  1 gun in locked safe.  No legal issues.      Substance Abuse History   Tobacco: quit 2015; previously 1PPD for 3 years  No alcohol or drug abuse reported.    Past Medical History:     Patient Active Problem List   Diagnosis   ? Schizoaffective disorder   ? BMI 29.0-29.9,adult   ? Special screening for malignant neoplasms, colon   ? History of colon polyps     Allergies:   Patient has no known allergies.    Current Home Medications:     Current Outpatient Medications    Medication Sig Start Date End Date Taking? Authorizing Provider   Cyanocobalamin (VITAMIN B 12 PO) Take 1 tablet by mouth daily.    Information, Historical   FLUoxetine (PROzac) 40 MG PO Capsule Take 1 capsule by mouth daily. 06/19/16   Verne Spurralandro, Christopher, ARNP   fluPHENAZine (PROLIXIN) 10 MG PO Tablet Take one tablet in the morning and two tablets at night 06/19/16   Calandro, Christopher, ARNP   GARLIC PO Take 1 tablet by mouth daily.    Information, Historical   ibuprofen (ADVIL,MOTRIN) 600 MG Tablet TAKE 1 TABLET BY MOUTH EVERY 8 HOURS AS NEEDED FOR PAIN 11/05/15   Wallis MartGreen, Kevin Roy, MD   PEG-3350 and electrolytes (GoLYTELY) 236 G Solution Reconstituted Use as directed 10/24/15   Caryl AspNasrallah, Victor N, PA-C   QUEtiapine (SEROquel) 400 MG PO Tablet TAKE 1 TABLET BY MOUTH EVERY NIGHT AT BEDTIME 06/19/16   Verne Spurralandro, Christopher, ARNP   traZODone (DESYREL) 50 MG PO Tablet TAKE 1 TABLET BY MOUTH EVERY NIGHT AT BEDTIME 06/19/16   Verne Spurralandro, Christopher, ARNP

## 2016-09-25 NOTE — Progress Notes
for key portions of exam and agrees with the above assessment and plan.    Seth KiefKitty Leung, MD, PGY-3  09/25/2016 2:00 PM  [facility]

## 2016-09-25 NOTE — Progress Notes
Fund of knowledge: average.  Judgment: fair.  Insight: fair.    DATA: from initial visit  PHQ-9 total score: 14 (score 10-14: moderate depression; significant for "more than half the days": depressed; insomnia; low energy; feel bad about self; poor concentration)    Diagnosis:   Axis I: Schizoaffective disorder; depressed type   Axis II: Deferred   Axis III: none   Axis IV: poor coping skills and chronic mental illness   Axis V: 41-50    Prognosis: Guarded for recovery.    Plan:   [place] Reveal[place] Chase will receive medication management at the [facility].    Medication Recommendations: (did not need refills for today's visit; has pillbox to help with adherence)  - Continue Seroquel 600mg  to target residual psychosis/paranoia and insomnia  - Continue Fluphenazine 10mg  am and 20mg  pm to target psychosis   - Discussed risks vs benefit of being on two antipsychotics; and goal to be on just one antipsychotic to control his symptoms.   - Continue Fluoxetine 40mg  daily for depression  - Continue Trazodone 50mg  prn for insomnia  - Went over Safety plan with name and numbers for people to contact and provide emergency crisis resource handout  -Medication side effect profiles, risks, and benefits were discussed with the patient.  - Patient encouraged to contact the clinic if experiencing any adverse reactions with medications.      Other Recommendations:   - If patient has any concerns for their own safety, due to adverse reactions to medications, suicidal or homicidal ideations, auditory or visual hallucinations, or delusions, they have been told to call 911 or go to the nearest emergency department.    RTC: 6 weeks     30 minutes were spent with the patient for psychoeducation, medication management, and supportive counseling.  Patient participated in discussion, asked appropriate questions, and agreed to the plan discussed above.    Patient discussed with attending physician, Dr. Ladona Ridgelaylor, who was present

## 2016-09-25 NOTE — Patient Instructions
-   Continue Seroquel 600mg  at night to target residual psychosis/paranoia and insomnia  - Continue Fluphenazine 10mg  am and 20mg  pm to target psychosis   - Continue Fluoxetine 40mg  daily for depression  - Continue Trazodone 50mg  as needed for insomnia/sleep

## 2016-09-29 NOTE — Progress Notes
Teaching/Attestation Statement  I saw and evaluated the patient with the resident. I was present in the room for the key portions of the exam including the HPI and mental status exam. I discussed the treatment recommendations with the patient and answered additional questions. I reviewed the resident's note and agree with the assessment and plan. See resident's note for details.

## 2016-10-29 DIAGNOSIS — F251 Schizoaffective disorder, depressive type: Principal | ICD-10-CM

## 2016-10-30 DIAGNOSIS — F251 Schizoaffective disorder, depressive type: Principal | ICD-10-CM

## 2016-10-30 MED ORDER — TRAZODONE HCL 50 MG PO TABS
1 refills | Status: CP
Start: 2016-10-30 — End: 2016-11-06

## 2016-10-30 MED ORDER — TRAZODONE HCL 50 MG PO TABS
3 refills | Status: CN
Start: 2016-10-30 — End: ?

## 2016-10-30 MED ORDER — FLUOXETINE HCL 40 MG PO CAPS
40 mg | Freq: Every day | ORAL | 1 refills | Status: CP
Start: 2016-10-30 — End: 2016-11-06

## 2016-10-30 MED ORDER — QUETIAPINE FUMARATE 400 MG PO TABS
1 refills | Status: CP
Start: 2016-10-30 — End: 2016-11-06

## 2016-10-30 MED ORDER — FLUOXETINE HCL 40 MG PO CAPS
40 mg | Freq: Every day | ORAL | 3 refills | Status: CN
Start: 2016-10-30 — End: ?

## 2016-10-30 MED ORDER — QUETIAPINE FUMARATE 400 MG PO TABS
3 refills | Status: CN
Start: 2016-10-30 — End: ?

## 2016-11-06 ENCOUNTER — Ambulatory Visit: Attending: Psychiatry | Primary: Internal Medicine

## 2016-11-06 DIAGNOSIS — F251 Schizoaffective disorder, depressive type: Principal | ICD-10-CM

## 2016-11-06 DIAGNOSIS — G4700 Insomnia, unspecified: Secondary | ICD-10-CM

## 2016-11-06 MED ORDER — TRAZODONE HCL 50 MG PO TABS
1 refills | Status: CP
Start: 2016-11-06 — End: 2016-12-11

## 2016-11-06 MED ORDER — FLUPHENAZINE HCL 10 MG PO TABS
3 refills | Status: CP
Start: 2016-11-06 — End: 2016-12-11

## 2016-11-06 MED ORDER — FLUOXETINE HCL 40 MG PO CAPS
40 mg | Freq: Every day | ORAL | 1 refills | Status: CP
Start: 2016-11-06 — End: 2016-12-11

## 2016-11-06 MED ORDER — QUETIAPINE FUMARATE 400 MG PO TABS
800 mg | Freq: Every evening | ORAL | 1 refills | Status: CP
Start: 2016-11-06 — End: 2016-12-11

## 2016-11-06 NOTE — Progress Notes
Prior psychiatric medication trials:  Cannot recall the names  Current psychiatric medication: see above  History of suicide attempts: hx of attempt 15-20 years ago  Self injurious behaviors: none reported.  History of violence or abuse: none reported.    Family History:    No Mental illness in family or Substance abuse in family    Social History   [place] in [place]; raised by mom and [employer]; 8 siblings (baby of the family).  Highest education level 8th grade.  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  [place]oved from [place] to [place] in [place]; resides with brother [name] and his son 53yo [name].  1 gun in locked safe.  No legal issues.      Substance Abuse History   Tobacco: quit 2015; previously 1PPD for 3 years  No alcohol or drug abuse reported.    Past Medical History:     Patient Active Problem List   Diagnosis   ? Schizoaffective disorder   ? BMI 29.0-29.9,adult   ? Special screening for malignant neoplasms, colon   ? History of colon polyps     Allergies:   Patient has no known allergies.    Current Home Medications:     Current Outpatient Medications    Medication Sig Start Date End Date Taking? Authorizing Provider   Cyanocobalamin (VITAMIN B 12 PO) Take 1 tablet by mouth daily.    Information, Historical   FLUoxetine (PROzac) 40 MG PO Capsule Take 1 capsule by mouth daily. 10/30/16   Zonia KiefLeung, Kitty, MD   fluPHENAZine (PROLIXIN) 10 MG PO Tablet Take one tablet in the morning and two tablets at night 06/19/16   Calandro, Cristal Deerhristopher, ARNP   GARLIC PO Take 1 tablet by mouth daily.    Information, Historical   ibuprofen (ADVIL,MOTRIN) 600 MG Tablet TAKE 1 TABLET BY MOUTH EVERY 8 HOURS AS NEEDED FOR PAIN 11/05/15   Wallis MartGreen, Kevin Roy, MD   PEG-3350 and electrolytes (GoLYTELY) 236 G Solution Reconstituted Use as directed 10/24/15   Caryl AspNasrallah, Victor N, PA-C   QUEtiapine (SEROquel) 400 MG PO Tablet TAKE 1 TABLET BY MOUTH EVERY NIGHT

## 2016-11-06 NOTE — Progress Notes
[facility]    Patient Name: [place]  Patient DOB: 04/01/1966  Patient MRN: [id number]  Primary Language: Lenox PondsEnglish    Provider: Zonia KiefKitty Leung, MD  Date of Service: 11/06/2016  Type of Service: follow up/med management  CPT Code: 5409899214 - Moderate complexity med. mgt. visit (some med changes, 30 min)   Insurance: Medicare Passenger transport [employer]care    Chief Complaint: "I'm doing ok. "    Interval History:   Seth RevealJoe Chase is a 51 y.o. AA male who resides with brother [name] and nephew in [place]. Has past psychiatric history significant for Schizoaffective disorder, depressed type.  Pt presents for follow up/med management.  Was previously seeing Bennie Hindhris Calandro, ARNP since 10/2013.  Last saw this provider on 09/02/2016 and increased Seroquel to 600mg  and continued Prozac 40mg , Fluphenazine 10mg  am and 20mg  pm, and Trazodone 50mg .      Since last visit overall doing well with no acute complaints.  Says voices have slightly improved since last visit but notes it was better controlled in 06/2016.  Notes some paranoia.  Endorses auditory hallucinations of male voices to kill himself or others.  Distracts self, goes for a walk, listens to music,or calls his brother. Continues to go on walk with dog ([name]). No issues with sleep (5-6 hours with Trazodone) and appetite.   No issues at home, things going well with family. [place] [facility] [place] [place].  [place] week to visit family this summer (August or September).  Last time he visited them was 3 years ago.  Was excused permanently from jury duty.      No signs of mania, hypomania, not observed responding to internal stimuli.  Denies SI/HI.  Is adherent with psychotropic medications with no adverse effects.      Psychiatric History   Inpatient psychiatric hospitalizations: [facility]x at [facility] in [place]; 9 years ago  Previous outpatient psychiatric treatment: [facility] in [place]  Prior therapy: none

## 2016-11-06 NOTE — Progress Notes
-  Medication side effect profiles, risks, and benefits were discussed with the patient.  - Patient encouraged to contact the clinic if experiencing any adverse reactions with medications.    - Requested [facility] from [facility] and [facility] in [place], [place]    I reviewed with the patient the potential risks of this medication (including metabolic side effects as well as risk of sudden death in the elderly), as well as the potential benefits (improving mood or psychotic symptoms).  I also presented alternatives including no medication.  The patient conveyed understanding by asking appropriate questions and provided informed consent. The patient was provided with an information sheet reviewing a more complete list of adverse effects.    Other Recommendations:   - If patient has any concerns for their own safety, due to adverse reactions to medications, suicidal or homicidal ideations, auditory or visual hallucinations, or delusions, they have been told to call 911 or go to the nearest emergency department.    RTC: 4-6 weeks     30 minutes were spent with the patient for psychoeducation, medication management, and supportive counseling.  Patient participated in discussion, asked appropriate questions, and agreed to the plan discussed above.    Patient discussed with attending physician, Dr. Ladona Ridgelaylor, who was present for key portions of exam and agrees with the above assessment and plan.    Zonia KiefKitty Leung, MD, PGY-3  11/06/2016 11:02 AM  [facility]

## 2016-11-06 NOTE — Patient Instructions
-   Increased Seroquel to 800mg  target residual psychosis/paranoia and insomnia    - Continue Fluphenazine 10mg  am and 20mg  pm to target psychosis     - Continue Fluoxetine 40mg  daily for depression    - Continue Trazodone 50mg  at night if needed for sleep

## 2016-11-06 NOTE — Progress Notes
LDL 135 (H) 04/14/2016    CHOL 199 04/14/2016    HGBA1C 5.0 04/14/2016     Mental Status Examination:   General/Appearance: 51 y.o. African American male who appears stated age, casually dressed.    Behavior: cooperative, engaged, pleasant, friendly.   Eye Contact: appropriate.  Psychomotor: without apparent psychomotor agitation or retardation, no abnormal movements noted.  Musculoskeletal: ambulates without difficulty  Speech/Language: fluent with normal rate, volume, and monotone.  Mood: LESS depressed.  Affect: congruent, appropriate for content, stable, normal range, euthymic.  Thought process: linear, coherent, goal directed, with no loose associations.    Thought content: some paranoid ideations present.  Orientation: person, place, situation, date, day of week, month, year.  Memory: remote and recent memory grossly intact.  Attention/Concentration: average.  Fund of knowledge: average.  Judgment: fair.  Insight: fair.    DATA: from initial visit  PHQ-9 total score: 14 (score 10-14: moderate depression; significant for "more than half the days": depressed; insomnia; low energy; feel bad about self; poor concentration)    Diagnosis:   Axis I: Schizoaffective disorder; depressed type   Axis II: Deferred   Axis III: none   Axis IV: poor coping skills and chronic mental illness   Axis V: 41-50    Prognosis: Guarded for recovery.    Plan:   Rosette RevealJoe Canty will receive medication management at the [facility].    Medication Recommendations:   - Increased Seroquel 600mg  to 800mg  target residual psychosis/paranoia and insomnia (he will monitor if any difference btwn 600 vs 800mg )  - Continue Fluphenazine 10mg  am and 20mg  pm to target psychosis (plan to titrate OFF)  - Discussed risks vs benefit of being on two antipsychotics; and goal to be on just one antipsychotic to control his symptoms.   - Continue Fluoxetine 40mg  daily for depression  - Continue Trazodone 50mg  prn for insomnia

## 2016-11-06 NOTE — Progress Notes
AT BEDTIME 10/30/16   Sharyne Richters, MD   traZODone (DESYREL) 50 MG PO Tablet TAKE 1 TABLET BY MOUTH EVERY NIGHT AT BEDTIME 10/30/16   Sharyne Richters, MD   Vitamin D 2000 UNITS Capsule Take 1 caplet by mouth daily.    Information, Historical     Review of systems:    Constitutional: denies significant weight loss/gain, fatigue  HEENT: denies rhinorrhea, sore throat.  Respiratory: denies cough, shortness of breath  Cardiovascular: denies chest pain  GI: denies N/V/C/D, abdominal pain.  MSK: denies joint pain, muscle stiffness/soreness, back pain, neck pain.  Neuro: denies HA, dizziness.  Psychiatric: as above and below.    Vital Signs:    There were no vitals taken for this visit.     Last Wt, Ht, and BMI    Wt Readings from Last 3 Encounters:   09/25/16 98.9 kg (218 lb)   09/02/16 96.5 kg (212 lb 12.8 oz)   08/10/16 97.3 kg (214 lb 9.6 oz)     Ht Readings from Last 3 Encounters:   09/25/16 1.829 m (6')   09/02/16 1.829 m (6')   08/10/16 1.829 m (Oliver age limit for growth percentiles is 20 years.    Lab Results:      CBC (with or without Differential):   Lab Results   Component Value Date    WBC 5.6 04/14/2016    HGB 12.7 (L) 04/14/2016    HCT 38.6 04/14/2016    MCV 81.3 04/14/2016    MCH 26.7 (L) 04/14/2016    MCHC 32.9 04/14/2016    RDW 14.1 04/14/2016    PLATCOUNT 311 04/14/2016    MPV 8.9 04/14/2016    NEUTROPCT 57.4 04/14/2016    LYMPHPCT 34.9 04/14/2016    MONOPCT 5.9 04/14/2016    EOSPCT 1.4 04/14/2016    BASOPCT 0.4 04/14/2016    and BMP/CMP:   Lab Results   Component Value Date    NA 140 04/14/2016    K 4.2 04/14/2016    CL 104 04/14/2016    CO2 26 04/14/2016    BUN 13 04/14/2016    CREATININE 1.20 04/14/2016    GLU 113 (H) 04/14/2016    CALCIUM 9.8 04/14/2016    ALB 4.2 10/03/2014    AST 15 10/03/2014    ALT 15 10/03/2014    EGFR 69 04/14/2016    TBILI 0.5 10/03/2014    ALKPHOS 100 10/03/2014     Lab Results   Component Value Date    TRIG 107 04/14/2016    HDL 43 04/14/2016

## 2016-11-09 NOTE — Progress Notes
Teaching/Attestation Statement  I saw and evaluated the patient with the resident. I was present in the room for the key portions of the exam including the HPI and mental status exam. I discussed the treatment recommendations with the patient and answered additional questions. I reviewed the resident's note and agree with the assessment and plan. See resident's note for details.

## 2016-12-11 ENCOUNTER — Ambulatory Visit: Attending: Psychiatry | Primary: Internal Medicine

## 2016-12-11 DIAGNOSIS — F251 Schizoaffective disorder, depressive type: Principal | ICD-10-CM

## 2016-12-11 MED ORDER — QUETIAPINE FUMARATE 400 MG PO TABS
800 mg | Freq: Every evening | ORAL | 1 refills | Status: CP
Start: 2016-12-11 — End: 2017-01-13

## 2016-12-11 MED ORDER — FLUOXETINE HCL 40 MG PO CAPS
40 mg | Freq: Every day | ORAL | 1 refills | Status: CP
Start: 2016-12-11 — End: 2017-01-13

## 2016-12-11 MED ORDER — TRAZODONE HCL 50 MG PO TABS
1 refills | Status: CP
Start: 2016-12-11 — End: 2017-01-13

## 2016-12-11 MED ORDER — FLUPHENAZINE HCL 10 MG PO TABS
3 refills | Status: CP
Start: 2016-12-11 — End: 2017-01-13

## 2016-12-11 NOTE — Progress Notes
History of suicide attempts: hx of attempt 15-20 years ago  Self injurious behaviors: none reported.  History of violence or abuse: none reported.    Family History:    No Mental illness in family or Substance abuse in family    Social History   Born in [place]; raised by mom and [employer]; 8 siblings (baby of the family).  Highest education level 8th grade.  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  [place] from [place] to [place] in [place]; resides with brother [place]) and his son 19yo [place]).  1 gun in locked safe.  No legal issues.  [employer].     Substance Abuse History   Tobacco: quit 2015; previously 1PPD for 3 years  No alcohol or drug abuse reported.    Past Medical History:     Patient Active Problem List   Diagnosis   ? Schizoaffective disorder   ? Special screening for malignant neoplasms, colon   ? History of colon polyps     Allergies:   Patient has no known allergies.    Current Home Medications:     Current Outpatient Medications    Medication Sig Start Date End Date Taking? Authorizing Provider   Cyanocobalamin (VITAMIN B 12 PO) Take 1 tablet by mouth daily.    Information, Historical   FLUoxetine (PROzac) 40 MG PO Capsule Take 1 capsule by mouth daily. 12/11/16   Zonia KiefLeung, Kitty, MD   FLUoxetine (PROzac) 40 MG PO Capsule Take 1 capsule by mouth daily. 11/06/16 12/11/16  Zonia KiefLeung, Kitty, MD   fluPHENAZine (PROLIXIN) 10 MG PO Tablet Take one tablet in the morning and two tablets at night 12/11/16   Zonia KiefLeung, Kitty, MD   fluPHENAZine (PROLIXIN) 10 MG PO Tablet Take one tablet in the morning and two tablets at night 11/06/16 12/11/16  Zonia KiefLeung, Kitty, MD   GARLIC PO Take 1 tablet by mouth daily.    Information, Historical   ibuprofen (ADVIL,MOTRIN) 600 MG Tablet TAKE 1 TABLET BY MOUTH EVERY 8 HOURS AS NEEDED FOR PAIN 11/05/15   Wallis MartGreen, Kevin Roy, MD   PEG-3350 and electrolytes (GoLYTELY) 236 G Solution Reconstituted Use as

## 2016-12-11 NOTE — Progress Notes
Medication Recommendations:   - Continue Seroquel 800mg  target residual psychosis/paranoia and insomnia   - Continue Fluphenazine 10mg  am and 20mg  pm to target psychosis (plan to titrate OFF on next visit)  - Discussed risks vs benefit of being on two antipsychotics; and goal to be on just one antipsychotic to control his symptoms.   - Continue Fluoxetine 40mg  daily for depression  - Continue Trazodone 50mg  PRN for insomnia  -Medication side effect profiles, risks, and benefits were discussed with the patient.  - Patient encouraged to contact the clinic if experiencing any adverse reactions with medications.      I reviewed with the patient the potential risks of this medication (including metabolic side effects as well as risk of sudden death in the elderly), as well as the potential benefits (improving mood or psychotic symptoms).  I also presented alternatives including no medication.  The patient conveyed understanding by asking appropriate questions and provided informed consent. The patient was provided with an information sheet reviewing a more complete list of adverse effects.    Other Recommendations:   - If patient has any concerns for their own safety, due to adverse reactions to medications, suicidal or homicidal ideations, auditory or visual hallucinations, or delusions, they have been told to call 911 or go to the nearest emergency department.    RTC: 4-6 weeks     30 minutes were spent with the patient for psychoeducation, medication management, and supportive counseling.  Patient participated in discussion, asked appropriate questions, and agreed to the plan discussed above.    Patient discussed with attending physician, Dr. Claris GladdenGale, who was present for key portions of exam and agrees with the above assessment and plan.    Zonia KiefKitty Leung, MD, PGY-3  12/11/2016 11:58 AM  [facility]

## 2016-12-11 NOTE — Progress Notes
Teaching/Attestation Statement  I saw and evaluated the patient.  I reviewed the Resident note and agree with the assessment and plan. See Resident's note for details.

## 2016-12-11 NOTE — Progress Notes
[facility]    Patient Name: Seth Chase  Patient DOB: 03/03/1966  Patient MRN: [id number]  Primary Language: Lenox PondsEnglish    Provider: Zonia KiefKitty Leung, MD  Date of Service: 12/11/2016  Type of Service: follow up/med management  CPT Code: 5409899214 - Moderate complexity med. mgt. visit (some med changes, 30 min)   Insurance: Medicare Passenger transport manager[employer]care    Chief Complaint: "I don't hear the voices as loud. "    Interval History:   Seth Chase is a 51 y.o. AA male who resides with brother Seth Fearing([place]) and nephew in [place]. Has past psychiatric history significant for Schizoaffective disorder, depressed type.  Pt presents for follow up/med management.  Was previously seeing Bennie Hindhris Calandro, ARNP since 10/2013.  Last saw this provider on 11/06/2016 and increased Seroquel to 800mg  and continued Prozac 40mg , Fluphenazine 10mg  am and 20mg  pm and Trazodone 50mg  prn.      Since last visit overall doing well with no acute complaints.  Reports mood and psychosis has improved since increasing Seroquel.  Voices and paranoia are improving.  Reports males voices that tell him to harm himself or others.  Will distract self (go on walk, talk to family members).  Rates depression 5/10 and anxiety 6/10. Sleep has improved (8 hours total), energy and appetite are good.  Continue to go on walks with dog ([name]).  States he will exercise more and has bicycle at home.  Things are going well at home.  Unsure when he will go to [place] to visit family members.  Plans to go to flea market and church with brother this weekend.      No signs of mania, hypomania, not observed responding to internal stimuli.  Denies SI/HI.  Is adherent with psychotropic medications with no adverse effects.      Psychiatric History   Inpatient psychiatric hospitalizations: [facility]x at [facility] in [place]; 9 years ago  Previous outpatient psychiatric treatment: [facility] in [place]  Prior therapy: none  Prior psychiatric medication trials:  Cannot recall the names

## 2016-12-11 NOTE — Progress Notes
NA 140 04/14/2016    K 4.2 04/14/2016    CL 104 04/14/2016    CO2 26 04/14/2016    BUN 13 04/14/2016    CREATININE 1.20 04/14/2016    GLU 113 (H) 04/14/2016    CALCIUM 9.8 04/14/2016    ALB 4.2 10/03/2014    AST 15 10/03/2014    ALT 15 10/03/2014    EGFR 69 04/14/2016    TBILI 0.5 10/03/2014    ALKPHOS 100 10/03/2014     Lab Results   Component Value Date    TRIG 107 04/14/2016    HDL 43 04/14/2016    LDL 135 (H) 04/14/2016    CHOL 199 04/14/2016    HGBA1C 5.0 04/14/2016     Mental Status Examination:   General/Appearance: 51 y.o. African American male who appears stated age, casually dressed.    Behavior: cooperative, engaged, pleasant, friendly, calm  Eye Contact: appropriate.  Psychomotor: without apparent psychomotor agitation or retardation, no abnormal movements noted.  Musculoskeletal: ambulates without difficulty  Speech/Language: fluent with normal rate, volume, and monotone.  Mood: LESS depressed.  Affect: congruent, appropriate for content, stable, normal range, euthymic.  Thought process: linear, coherent, goal directed, with no loose associations.    Thought content: some paranoid ideations present.  Orientation: person, place, situation, date, day of week, month, year.  Memory: remote and recent memory grossly intact.  Attention/Concentration: average.  Fund of knowledge: average.  Judgment: fair.  Insight: fair.    DATA:   8/3: PHQ 9 total score: 17  And GAD 7 total score: 8 (reviewed and scanned)  4/2:PHQ-9 total score: 14 (score 10-14: moderate depression; significant for "more than half the days": depressed; insomnia; low energy; feel bad about self; poor concentration)    Diagnosis:   Axis I: Schizoaffective disorder; depressed type   Axis II: Deferred   Axis III: none   Axis IV: poor coping skills and chronic mental illness   Axis V: 41-50    Prognosis: Guarded for recovery.    Plan:   [place] will receive medication management at the [facility].

## 2016-12-11 NOTE — Progress Notes
directed 10/24/15   Caryl AspNasrallah, Victor N, PA-C   QUEtiapine (SEROquel) 400 MG PO Tablet Take 2 tablets by mouth nightly at bedtime. 12/11/16   Zonia KiefLeung, Kitty, MD   QUEtiapine (SEROquel) 400 MG PO Tablet Take 2 tablets by mouth nightly at bedtime. 11/06/16 12/11/16  Zonia KiefLeung, Kitty, MD   traZODone (DESYREL) 50 MG PO Tablet TAKE 1 TABLET BY MOUTH EVERY NIGHT AT BEDTIME 12/11/16   Zonia KiefLeung, Kitty, MD   traZODone (DESYREL) 50 MG PO Tablet TAKE 1 TABLET BY MOUTH EVERY NIGHT AT BEDTIME 11/06/16 12/11/16  Zonia KiefLeung, Kitty, MD   Vitamin D 2000 UNITS Capsule Take 1 caplet by mouth daily.    Information, Historical     Review of systems:    Constitutional: +weight gain; denies fatigue  HEENT: denies rhinorrhea, sore throat.  Respiratory: denies cough, shortness of breath  Cardiovascular: denies chest pain  GI: denies N/V/C/D, abdominal pain.  MSK: denies joint pain, muscle stiffness/soreness, back pain, neck pain.  Neuro: denies HA, dizziness.  Psychiatric: as above and below.    Vital Signs:    BP 140/88  - Pulse 92  - Temp 37.4 ?C (99.3 ?F)  - Resp 18  - Ht 1.829 m (6')  - Wt 100.8 kg (222 lb 3.2 oz)  - BMI 30.14 kg/m?      Last Wt, Ht, and BMI    Wt Readings from Last 3 Encounters:   12/11/16 100.8 kg (222 lb 3.2 oz)   11/06/16 100.4 kg (221 lb 6.4 oz)   09/25/16 98.9 kg (218 lb)     Ht Readings from Last 3 Encounters:   12/11/16 1.829 m (6')   11/06/16 1.829 m (6')   09/25/16 1.829 m (6')     Facility age limit for growth percentiles is 20 years.    Lab Results:      CBC (with or without Differential):   Lab Results   Component Value Date    WBC 5.6 04/14/2016    HGB 12.7 (L) 04/14/2016    HCT 38.6 04/14/2016    MCV 81.3 04/14/2016    MCH 26.7 (L) 04/14/2016    MCHC 32.9 04/14/2016    RDW 14.1 04/14/2016    PLATCOUNT 311 04/14/2016    MPV 8.9 04/14/2016    NEUTROPCT 57.4 04/14/2016    LYMPHPCT 34.9 04/14/2016    MONOPCT 5.9 04/14/2016    EOSPCT 1.4 04/14/2016    BASOPCT 0.4 04/14/2016    and BMP/CMP:   Lab Results   Component Value Date

## 2017-01-13 ENCOUNTER — Ambulatory Visit: Attending: Psychiatry | Primary: Internal Medicine

## 2017-01-13 DIAGNOSIS — F251 Schizoaffective disorder, depressive type: Principal | ICD-10-CM

## 2017-01-13 MED ORDER — FLUPHENAZINE HCL 10 MG PO TABS
10 mg | Freq: Two times a day (BID) | ORAL | 2 refills | Status: CP
Start: 2017-01-13 — End: 2017-04-20

## 2017-01-13 MED ORDER — QUETIAPINE FUMARATE 400 MG PO TABS
800 mg | Freq: Every evening | ORAL | 1 refills | Status: CP
Start: 2017-01-13 — End: 2017-03-24

## 2017-01-13 MED ORDER — TRAZODONE HCL 50 MG PO TABS
1 refills | Status: CP
Start: 2017-01-13 — End: 2017-03-24

## 2017-01-13 MED ORDER — FLUOXETINE HCL 40 MG PO CAPS
40 mg | Freq: Every day | ORAL | 1 refills | Status: CP
Start: 2017-01-13 — End: 2017-04-20

## 2017-01-13 NOTE — Progress Notes
Vitamin D 2000 UNITS Capsule Take 1 caplet by mouth daily.    Information, Historical     Review of systems:    Constitutional: +weight gain; denies fatigue  HEENT: denies rhinorrhea, sore throat.  Respiratory: denies cough, shortness of breath  Cardiovascular: denies chest pain  GI: denies N/V/C/D, abdominal pain.  MSK: denies joint pain, muscle stiffness/soreness, back pain, neck pain.  Neuro: denies HA, dizziness.  Psychiatric: as above and below.    Vital Signs:    BP (!) 143/92  - Pulse 103  - Temp 37 ?C (98.6 ?F)  - Resp 20  - Ht 1.829 m (6')  - Wt 103 kg (227 lb)  - BMI 30.79 kg/m?      Last Wt, Ht, and BMI    Wt Readings from Last 3 Encounters:   01/13/17 103 kg (227 lb)   12/11/16 100.8 kg (222 lb 3.2 oz)   11/06/16 100.4 kg (221 lb 6.4 oz)     Ht Readings from Last 3 Encounters:   01/13/17 1.829 m (6')   12/11/16 1.829 m (6')   11/06/16 1.829 m ([place] age limit for growth percentiles is 20 years.    Lab Results:      CBC (with or without Differential):   Lab Results   Component Value Date    WBC 5.6 04/14/2016    HGB 12.7 (L) 04/14/2016    HCT 38.6 04/14/2016    MCV 81.3 04/14/2016    MCH 26.7 (L) 04/14/2016    MCHC 32.9 04/14/2016    RDW 14.1 04/14/2016    PLATCOUNT 311 04/14/2016    MPV 8.9 04/14/2016    NEUTROPCT 57.4 04/14/2016    LYMPHPCT 34.9 04/14/2016    MONOPCT 5.9 04/14/2016    EOSPCT 1.4 04/14/2016    BASOPCT 0.4 04/14/2016    and BMP/CMP:   Lab Results   Component Value Date    NA 140 04/14/2016    K 4.2 04/14/2016    CL 104 04/14/2016    CO2 26 04/14/2016    BUN 13 04/14/2016    CREATININE 1.20 04/14/2016    GLU 113 (H) 04/14/2016    CALCIUM 9.8 04/14/2016    ALB 4.2 10/03/2014    AST 15 10/03/2014    ALT 15 10/03/2014    EGFR 69 04/14/2016    TBILI 0.5 10/03/2014    ALKPHOS 100 10/03/2014     Lab Results   Component Value Date    TRIG 107 04/14/2016    HDL 43 04/14/2016    LDL 135 (H) 04/14/2016    CHOL 199 04/14/2016    HGBA1C 5.0 04/14/2016     Mental Status Examination:

## 2017-01-13 NOTE — Progress Notes
General/Appearance: 51 y.o. African American male who appears stated age, casually dressed.    Behavior: cooperative, engaged, pleasant, friendly, calm  Eye Contact: appropriate.  Psychomotor: without apparent psychomotor agitation or retardation, no abnormal movements noted.  Musculoskeletal: ambulates without difficulty  Speech/Language: fluent with normal rate, volume, and monotone.  Mood: LESS depressed.  Affect: congruent, appropriate for content, stable, normal range, euthymic.  Thought process: linear, coherent, goal directed, with no loose associations.    Thought content: some paranoid ideations present.  Orientation: person, place, situation, date, day of week, month, year.  Memory: remote and recent memory grossly intact.  Attention/Concentration: average.  Fund of knowledge: average.  Judgment: fair.  Insight: fair.    DATA:   8/3: PHQ 9 total score: 17  And GAD 7 total score: 8 (reviewed and scanned)  4/2:PHQ-9 total score: 14 (score 10-14: moderate depression; significant for "more than half the days": depressed; insomnia; low energy; feel bad about self; poor concentration)    Diagnosis:   Axis I: Schizoaffective disorder; depressed type   Axis II: Deferred   Axis III: none   Axis IV: poor coping skills and chronic mental illness   Axis V: 50-55    Prognosis: Fair for recovery.    Plan:   Seth Chase will receive medication management at the [facility].    Medication Recommendations:   - Continue Seroquel 800mg  target residual psychosis/paranoia and insomnia (encouraged to diet and exercise; side effect: weight gain)  - Decrease  Fluphenazine 10mg  am and 20mg  pm to 10mg  BID to target psychosis (plan to titrate OFF)  - Discussed risks vs benefit of being on two antipsychotics; and goal to be on just one antipsychotic to control his symptoms.   - Continue Fluoxetine 40mg  daily for depression  - Continue Trazodone 50mg  PRN for insomnia

## 2017-01-13 NOTE — Patient Instructions
?   Get up, stretch, and walk around every 30 minutes throughout the day.  What guidelines should I follow while exercising?  ? Do not exercise so much that you hurt yourself, feel dizzy, or get very short of breath.  ? Consult your health care provider prior to starting a new exercise program.  ? Wear comfortable clothes and shoes with good support.  ? Drink plenty of water while you exercise to prevent dehydration or heat stroke. Body water is lost during exercise and must be replaced.  ? Work out until you breathe faster and your heart beats faster.  This information is not intended to replace advice given to you by your health care provider. Make sure you discuss any questions you have with your health care provider.  Document Released: 05/30/2010 Document Revised: 10/03/2015 Document Reviewed: 09/28/2013  Elsevier Interactive Patient Education ? 2018 Elsevier Inc.

## 2017-01-13 NOTE — Patient Instructions
-   Continue Seroquel  target residual psychosis/paranoia and insomnia     - DECREASE Fluphenazine  am and  pm to target psychosis (plan to slowly titrate OFF)    - Discussed risks vs benefit of being on two antipsychotics; and goal to be on just one antipsychotic to control his symptoms.     - Continue Fluoxetine  daily for depression    - Continue Trazodone  PRN for insomnia    - TRY MELATONIN (over the counter) for sleep    - FOLLOW UP WITH PCP ABOUT BLOOD PRESSURE!          Preventing Cerebrovascular Disease  Arteries are blood vessels that carry blood that contains oxygen from the heart to all parts of the body. Cerebrovascular disease affects arteries that supply the brain. Any condition that blocks or disrupts blood flow to the brain can cause cerebrovascular disease. Brain cells that lose blood supply start to die within minutes (stroke). Stroke is the main danger of cerebrovascular disease.  Atherosclerosis and high blood pressure are common causes of cerebrovascular disease. Atherosclerosis is narrowing and hardening of an artery that results when fat, cholesterol, calcium, or other substances (plaque) build up inside an artery. Plaque reduces blood flow through the artery. High blood pressure increases the risk of bleeding inside the brain.  Making diet and lifestyle changes to prevent atherosclerosis and high blood pressure lowers your risk of cerebrovascular disease.  What nutrition changes can be made?  ? Eat more fruits, vegetables, and whole grains.  ? Reduce how much saturated fat you eat. To do this, eat less red meat and fewer full-fat dairy products.  ? Eat healthy proteins instead of red meat. Healthy proteins include:  ? Fish. Eat fish that contains heart-healthy omega-3 fatty acids, twice a week. Examples include salmon, albacore tuna, mackerel, and herring.  ? Chicken.  ? Nuts.  ? Low-fat or nonfat yogurt.  ? Avoid processed meats, like bacon and lunchmeat.

## 2017-01-13 NOTE — Patient Instructions
disease:  ? Being overweight.  ? Smoking.  ? Being physically inactive.  ? Eating a high-fat diet.  ? Having certain health conditions, such as:  ? Diabetes.  ? High blood pressure.  ? Heart disease.  ? Atherosclerosis.  ? High cholesterol.  ? Sickle cell disease.  Talk with your health care provider about your risk for cerebrovascular disease. Work with your health care provider to control diseases that you have that may contribute to cerebrovascular disease. Your health care provider may prescribe medicines to help prevent major causes of cerebrovascular disease.  Where to find more information:  Learn more about preventing cerebrovascular disease from:  ? National Heart, Lung, and Blood Institute: ClassGossip.plwww.nhlbi.nih.gov/health/health-topics/topics/stroke  ? Centers for Disease Control and Prevention: http://www.gonzalez-chase.net/cdc.gov/stroke/about.htm  Summary  ? Cerebrovascular disease can lead to a stroke.  ? Atherosclerosis and high blood pressure are major causes of cerebrovascular disease.  ? Making diet and lifestyle changes can reduce your risk of cerebrovascular disease.  ? Work with your health care provider to get your risk factors under control to reduce your risk of cerebrovascular disease.  This information is not intended to replace advice given to you by your health care provider. Make sure you discuss any questions you have with your health care provider.  Document Released: 05/12/2015 Document Revised: 11/15/2015 Document Reviewed: 05/12/2015  Elsevier Interactive Patient Education ? 2018 Elsevier Inc.      Exercising to Owens [employer] MinorLose Weight  Exercising can help you to lose weight. In order to lose weight through exercise, you need to do vigorous-intensity exercise. You can tell that you are exercising with vigorous intensity if you are breathing very hard and fast and cannot hold a conversation while exercising.  Moderate-intensity exercise helps to maintain your current weight. You can

## 2017-01-13 NOTE — Progress Notes
[facility]    Patient Name: Seth Chase Figuereo  Patient DOB: 10/10/1965  Patient MRN: [id number]  Primary Language: Lenox PondsEnglish    Provider: Zonia KiefKitty Leung, MD  Date of Service: 01/13/2017  Type of Service: follow up/med management  CPT Code: [id number] - Moderate complexity med. mgt. visit (some med changes, 30 min)   Insurance: Medicare [employer]    Chief Complaint: "medication management. "    Interval History:   Seth Chase Modi is a 51 y.o. AA male who resides with brother [name] and nephew in [place]. Has past psychiatric history significant for Schizoaffective disorder, depressed type.  Pt presents for follow up/med management.  Was previously seeing Bennie Hindhris Calandro, ARNP since 10/2013.  Last saw this provider on 12/11/2016 and increased Seroquel to 800mg  and continued Prozac 40mg , Fluphenazine 10mg  am and 20mg  pm and Trazodone 50mg  prn.      Since last visit overall doing well with no acute complaints.  Reports mood and psychosis has improved since increasing Seroquel.  Chronic psychosis and voices, paranoia are improving.  Reports males voices that tell him to harm himself or others but states "they are less loud."  Will distract self (go on walk, talk to family members).  Started cycling three times a week for 15 minutes.  Has stopped eating sweets and trying to eat more veggies and fruits.  No changes with sleep (take Trazodone prn) and appetite.  Good social support from family and church and dog ([name]).      No signs of mania, hypomania, not observed responding to internal stimuli.  Denies SI/HI.  Is adherent with psychotropic medications with no adverse effects.      Psychiatric History   [facility] [place]; 9 years ago  Previous outpatient psychiatric treatment: [facility] in [place]  Prior therapy: none  Prior psychiatric medication trials:  Cannot recall the names  History of suicide attempts: hx of attempt 15-20 years ago

## 2017-01-13 NOTE — Patient Instructions
?   Fish. Eat fish that contains heart-healthy omega-3 fatty acids, twice a week. Examples include salmon, albacore tuna, mackerel, and herring.  ? Chicken.  ? Nuts.  ? Low-fat or nonfat yogurt.  ? Avoid processed meats, like bacon and lunchmeat.  ? Avoid foods that contain:  ? A lot of sugar, such as sweets and drinks with added sugar.  ? A lot of salt (sodium). Avoid adding extra salt to your food, as told by your health care provider.  ? Trans fats, such as margarine and baked goods. Trans fats may be listed as "partially hydrogenated oils? on food labels.  ? Check food labels to see how much sodium, sugar, and trans fats are in foods.  ? Use vegetable oils that contain low amounts of saturated fat, such as olive oil or canola oil.  What lifestyle changes can be made?  ? Drink alcohol in moderation. This means no more than 1 drink a day for nonpregnant women and 2 drinks a day for men. One drink equals 12 oz of beer, 5 oz of wine, or 1? oz of hard liquor.  ? If you are overweight, ask your health care provider to recommend a weight-loss plan for you. Losing 5?10 lb (2.2?4.5 kg) can reduce your risk of diabetes, atherosclerosis, and high blood pressure.  ? Exercise for 30?60 minutes on most days, or as much as told by your health care provider.  ? Do moderate-intensity exercise, such as brisk walking, bicycling, and water aerobics. Ask your health care provider which activities are safe for you.  ? Do not use any products that contain nicotine or tobacco, such as cigarettes and e-cigarettes. If you need help quitting, ask your health care provider.  Why are these changes important?  Making these changes lowers your risk of many diseases that can cause cerebrovascular disease and stroke. Stroke is a leading cause of death and disability. Making these changes also improves your overall health and quality of life.  What can I do to lower my risk?  The following factors make you more likely to develop cerebrovascular

## 2017-01-13 NOTE — Progress Notes
-   follow up with pcp in regards to blood pressure  -Medication side effect profiles, risks, and benefits were discussed with the patient.  - Patient encouraged to contact the clinic if experiencing any adverse reactions with medications.      I reviewed with the patient the potential risks of this medication (including metabolic side effects as well as risk of sudden death in the elderly), as well as the potential benefits (improving mood or psychotic symptoms).  I also presented alternatives including no medication.  The patient conveyed understanding by asking appropriate questions and provided informed consent. The patient was provided with an information sheet reviewing a more complete list of adverse effects.    Other Recommendations:   - If patient has any concerns for their own safety, due to adverse reactions to medications, suicidal or homicidal ideations, auditory or visual hallucinations, or delusions, they have been told to call 911 or go to the nearest emergency department.    RTC: 6 weeks     30 minutes were spent with the patient for psychoeducation, medication management, and supportive counseling.  Patient participated in discussion, asked appropriate questions, and agreed to the plan discussed above.    Patient discussed with attending physician, Dr. Ladona Ridgelaylor, who was present for key portions of exam and agrees with the above assessment and plan.    Zonia KiefKitty Leung, MD, PGY-3  01/13/2017 11:30 AM  [facility]

## 2017-01-13 NOTE — Patient Instructions
-   Continue Seroquel  target residual psychosis/paranoia and insomnia     - DECREASE Fluphenazine  am and  pm to target psychosis (plan to slowly titrate OFF)    - Discussed risks vs benefit of being on two antipsychotics; and goal to be on just one antipsychotic to control his symptoms.     - Continue Fluoxetine  daily for depression    - Continue Trazodone  PRN for insomnia    - TRY MELATONIN (over the counter) for sleep          Preventing Cerebrovascular Disease  Arteries are blood vessels that carry blood that contains oxygen from the heart to all parts of the body. Cerebrovascular disease affects arteries that supply the brain. Any condition that blocks or disrupts blood flow to the brain can cause cerebrovascular disease. Brain cells that lose blood supply start to die within minutes (stroke). Stroke is the main danger of cerebrovascular disease.  Atherosclerosis and high blood pressure are common causes of cerebrovascular disease. Atherosclerosis is narrowing and hardening of an artery that results when fat, cholesterol, calcium, or other substances (plaque) build up inside an artery. Plaque reduces blood flow through the artery. High blood pressure increases the risk of bleeding inside the brain.  Making diet and lifestyle changes to prevent atherosclerosis and high blood pressure lowers your risk of cerebrovascular disease.  What nutrition changes can be made?  ? Eat more fruits, vegetables, and whole grains.  ? Reduce how much saturated fat you eat. To do this, eat less red meat and fewer full-fat dairy products.  ? Eat healthy proteins instead of red meat. Healthy proteins include:  ? Fish. Eat fish that contains heart-healthy omega-3 fatty acids, twice a week. Examples include salmon, albacore tuna, mackerel, and herring.  ? Chicken.  ? Nuts.  ? Low-fat or nonfat yogurt.  ? Avoid processed meats, like bacon and lunchmeat.  ? Avoid foods that contain:

## 2017-01-13 NOTE — Patient Instructions
-   Continue Seroquel  target residual psychosis/paranoia and insomnia     - DECREASE Fluphenazine  am and  pm to target psychosis (plan to slowly titrate OFF)    - Discussed risks vs benefit of being on two antipsychotics; and goal to be on just one antipsychotic to control his symptoms.     - Continue Fluoxetine  daily for depression    - Continue Trazodone  PRN for insomnia    - TRY MELATONIN (over the counter) for sleep    - FOLLOW UP WITH PCP ABOUT BLOOD PRESSURE  VITALS ON TODAYS VISIT: (BP (!) 143/92  - Pulse 103  - Temp 37 ?C (98.6 ?F)  - Resp 20  - Ht 1.829 m (6')  - Wt 103 kg (227 lb)  - BMI 30.79 kg/m?     (blood pressure has been high on all visits; was previously on bp medication)            Preventing Cerebrovascular Disease  Arteries are blood vessels that carry blood that contains oxygen from the heart to all parts of the body. Cerebrovascular disease affects arteries that supply the brain. Any condition that blocks or disrupts blood flow to the brain can cause cerebrovascular disease. Brain cells that lose blood supply start to die within minutes (stroke). Stroke is the main danger of cerebrovascular disease.  Atherosclerosis and high blood pressure are common causes of cerebrovascular disease. Atherosclerosis is narrowing and hardening of an artery that results when fat, cholesterol, calcium, or other substances (plaque) build up inside an artery. Plaque reduces blood flow through the artery. High blood pressure increases the risk of bleeding inside the brain.  Making diet and lifestyle changes to prevent atherosclerosis and high blood pressure lowers your risk of cerebrovascular disease.  What nutrition changes can be made?  ? Eat more fruits, vegetables, and whole grains.  ? Reduce how much saturated fat you eat. To do this, eat less red meat and fewer full-fat dairy products.  ? Eat healthy proteins instead of red meat. Healthy proteins include:

## 2017-01-13 NOTE — Patient Instructions
?   Wear comfortable clothes and shoes with good support.  ? Drink plenty of water while you exercise to prevent dehydration or heat stroke. Body water is lost during exercise and must be replaced.  ? Work out until you breathe faster and your heart beats faster.  This information is not intended to replace advice given to you by your health care provider. Make sure you discuss any questions you have with your health care provider.  Document Released: 05/30/2010 Document Revised: 10/03/2015 Document Reviewed: 09/28/2013  Elsevier Interactive Patient Education ? 2018 Elsevier Inc.

## 2017-01-13 NOTE — Patient Instructions
?   Sickle cell disease.  Talk with your health care provider about your risk for cerebrovascular disease. Work with your health care provider to control diseases that you have that may contribute to cerebrovascular disease. Your health care provider may prescribe medicines to help prevent major causes of cerebrovascular disease.  Where to find more information:  Learn more about preventing cerebrovascular disease from:  ? National Heart, Lung, and Blood Institute: ClassGossip.plwww.nhlbi.nih.gov/health/health-topics/topics/stroke  ? Centers for Disease Control and Prevention: http://www.gonzalez-chase.net/cdc.gov/stroke/about.htm  Summary  ? Cerebrovascular disease can lead to a stroke.  ? Atherosclerosis and high blood pressure are major causes of cerebrovascular disease.  ? Making diet and lifestyle changes can reduce your risk of cerebrovascular disease.  ? Work with your health care provider to get your risk factors under control to reduce your risk of cerebrovascular disease.  This information is not intended to replace advice given to you by your health care provider. Make sure you discuss any questions you have with your health care provider.  Document Released: 05/12/2015 Document Revised: 11/15/2015 Document Reviewed: 05/12/2015  Elsevier Interactive Patient Education ? 2018 Elsevier Inc.      Exercising to [employer] MinorLose Weight  Exercising can help you to lose weight. In order to lose weight through exercise, you need to do vigorous-intensity exercise. You can tell that you are exercising with vigorous intensity if you are breathing very hard and fast and cannot hold a conversation while exercising.  Moderate-intensity exercise helps to maintain your current weight. You can tell that you are exercising at a moderate level if you have a higher heart rate and faster breathing, but you are still able to hold a conversation.  How often should I exercise?  Choose an activity that you enjoy and set realistic goals. Your health

## 2017-01-13 NOTE — Patient Instructions
?   A lot of sugar, such as sweets and drinks with added sugar.  ? A lot of salt (sodium). Avoid adding extra salt to your food, as told by your health care provider.  ? Trans fats, such as margarine and baked goods. Trans fats may be listed as "partially hydrogenated oils? on food labels.  ? Check food labels to see how much sodium, sugar, and trans fats are in foods.  ? Use vegetable oils that contain low amounts of saturated fat, such as olive oil or canola oil.  What lifestyle changes can be made?  ? Drink alcohol in moderation. This means no more than 1 drink a day for nonpregnant women and 2 drinks a day for men. One drink equals 12 oz of beer, 5 oz of wine, or 1? oz of hard liquor.  ? If you are overweight, ask your health care provider to recommend a weight-loss plan for you. Losing 5?10 lb (2.2?4.5 kg) can reduce your risk of diabetes, atherosclerosis, and high blood pressure.  ? Exercise for 30?60 minutes on most days, or as much as told by your health care provider.  ? Do moderate-intensity exercise, such as brisk walking, bicycling, and water aerobics. Ask your health care provider which activities are safe for you.  ? Do not use any products that contain nicotine or tobacco, such as cigarettes and e-cigarettes. If you need help quitting, ask your health care provider.  Why are these changes important?  Making these changes lowers your risk of many diseases that can cause cerebrovascular disease and stroke. Stroke is a leading cause of death and disability. Making these changes also improves your overall health and quality of life.  What can I do to lower my risk?  The following factors make you more likely to develop cerebrovascular disease:  ? Being overweight.  ? Smoking.  ? Being physically inactive.  ? Eating a high-fat diet.  ? Having certain health conditions, such as:  ? Diabetes.  ? High blood pressure.  ? Heart disease.  ? Atherosclerosis.  ? High cholesterol.  ? Sickle cell disease.

## 2017-01-13 NOTE — Progress Notes
Self injurious behaviors: none reported.  History of violence or abuse: none reported.    Family History:    No Mental illness in family or Substance abuse in family    Social History   [place]; raised by mom and [employer]; 8 siblings (baby of the family).  Highest education level 8th grade.  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  [place]oved from [place] to [place] in 2015; resides with brother [name] and his son 19yo [name].  1 gun in locked safe.  No legal issues.  [employer].     Substance Abuse History   Tobacco: quit 2015; previously 1PPD for 3 years  No alcohol or drug abuse reported.    Past Medical History:     Patient Active Problem List   Diagnosis   ? Schizoaffective disorder   ? Special screening for malignant neoplasms, colon   ? History of colon polyps     Allergies:   Patient has no known allergies.    Current Home Medications:     Current Outpatient Medications    Medication Sig Start Date End Date Taking? Authorizing Provider   Cyanocobalamin (VITAMIN B 12 PO) Take 1 tablet by mouth daily.    Information, Historical   FLUoxetine (PROzac) 40 MG PO Capsule Take 1 capsule by mouth daily. 12/11/16   Zonia KiefLeung, Kitty, MD   fluPHENAZine (PROLIXIN) 10 MG PO Tablet Take one tablet in the morning and two tablets at night 12/11/16   Zonia KiefLeung, Kitty, MD   GARLIC PO Take 1 tablet by mouth daily.    Information, Historical   ibuprofen (ADVIL,MOTRIN) 600 MG Tablet TAKE 1 TABLET BY MOUTH EVERY 8 HOURS AS NEEDED FOR PAIN 11/05/15   Wallis MartGreen, Kevin Roy, MD   PEG-3350 and electrolytes (GoLYTELY) 236 G Solution Reconstituted Use as directed 10/24/15   Caryl AspNasrallah, Victor N, PA-C   QUEtiapine (SEROquel) 400 MG PO Tablet Take 2 tablets by mouth nightly at bedtime. 12/11/16   Zonia KiefLeung, Kitty, MD   traZODone (DESYREL) 50 MG PO Tablet TAKE 1 TABLET BY MOUTH EVERY NIGHT AT BEDTIME 12/11/16   Zonia KiefLeung, Kitty, MD

## 2017-01-13 NOTE — Patient Instructions
care provider can help you to make an activity plan that works for you. Exercise regularly as directed by your health care provider. This may include:  ? Doing resistance training twice each week, such as:  ? Push-ups.  ? Sit-ups.  ? Lifting weights.  ? Using resistance bands.  ? Doing a given intensity of exercise for a given amount of time. Choose from these options:  ? 150 minutes of moderate-intensity exercise every week.  ? 75 minutes of vigorous-intensity exercise every week.  ? A mix of moderate-intensity and vigorous-intensity exercise every week.  Children, pregnant women, people who are out of shape, people who are overweight, and older adults may need to consult a health care provider for individual recommendations. If you have any sort of medical condition, be sure to consult your health care provider before starting a new exercise program.  What are some activities that can help me to lose weight?  ? Walking at a rate of at least 4.5 miles an hour.  ? Jogging or running at a rate of 5 miles per hour.  ? Biking at a rate of at least 10 miles per hour.  ? Lap swimming.  ? Roller-skating or in-line skating.  ? Cross-country skiing.  ? Vigorous competitive sports, such as football, basketball, and soccer.  ? Jumping rope.  ? Aerobic dancing.  How can I be more active in my day-to-day activities?  ? Use the stairs instead of the elevator.  ? Take a walk during your lunch break.  ? If you drive, park your car farther away from work or school.  ? If you take public transportation, get off one stop early and walk the rest of the way.  ? Make all of your phone calls while standing up and walking around.  ? Get up, stretch, and walk around every 30 minutes throughout the day.  What guidelines should I follow while exercising?  ? Do not exercise so much that you hurt yourself, feel dizzy, or get very short of breath.  ? Consult your health care provider prior to starting a new exercise program.

## 2017-01-13 NOTE — Patient Instructions
?   Avoid foods that contain:  ? A lot of sugar, such as sweets and drinks with added sugar.  ? A lot of salt (sodium). Avoid adding extra salt to your food, as told by your health care provider.  ? Trans fats, such as margarine and baked goods. Trans fats may be listed as "partially hydrogenated oils? on food labels.  ? Check food labels to see how much sodium, sugar, and trans fats are in foods.  ? Use vegetable oils that contain low amounts of saturated fat, such as olive oil or canola oil.  What lifestyle changes can be made?  ? Drink alcohol in moderation. This means no more than 1 drink a day for nonpregnant women and 2 drinks a day for men. One drink equals 12 oz of beer, 5 oz of wine, or 1? oz of hard liquor.  ? If you are overweight, ask your health care provider to recommend a weight-loss plan for you. Losing 5?10 lb (2.2?4.5 kg) can reduce your risk of diabetes, atherosclerosis, and high blood pressure.  ? Exercise for 30?60 minutes on most days, or as much as told by your health care provider.  ? Do moderate-intensity exercise, such as brisk walking, bicycling, and water aerobics. Ask your health care provider which activities are safe for you.  ? Do not use any products that contain nicotine or tobacco, such as cigarettes and e-cigarettes. If you need help quitting, ask your health care provider.  Why are these changes important?  Making these changes lowers your risk of many diseases that can cause cerebrovascular disease and stroke. Stroke is a leading cause of death and disability. Making these changes also improves your overall health and quality of life.  What can I do to lower my risk?  The following factors make you more likely to develop cerebrovascular disease:  ? Being overweight.  ? Smoking.  ? Being physically inactive.  ? Eating a high-fat diet.  ? Having certain health conditions, such as:  ? Diabetes.  ? High blood pressure.  ? Heart disease.  ? Atherosclerosis.  ? High cholesterol.

## 2017-01-13 NOTE — Patient Instructions
Talk with your health care provider about your risk for cerebrovascular disease. Work with your health care provider to control diseases that you have that may contribute to cerebrovascular disease. Your health care provider may prescribe medicines to help prevent major causes of cerebrovascular disease.  Where to find more information:  Learn more about preventing cerebrovascular disease from:  ? National Heart, Lung, and Blood Institute: ClassGossip.plwww.nhlbi.nih.gov/health/health-topics/topics/stroke  ? Centers for Disease Control and Prevention: http://www.gonzalez-chase.net/cdc.gov/stroke/about.htm  Summary  ? Cerebrovascular disease can lead to a stroke.  ? Atherosclerosis and high blood pressure are major causes of cerebrovascular disease.  ? Making diet and lifestyle changes can reduce your risk of cerebrovascular disease.  ? Work with your health care provider to get your risk factors under control to reduce your risk of cerebrovascular disease.  This information is not intended to replace advice given to you by your health care provider. Make sure you discuss any questions you have with your health care provider.  Document Released: 05/12/2015 Document Revised: 11/15/2015 Document Reviewed: 05/12/2015  Elsevier Interactive Patient Education ? 2018 Elsevier Inc.      Exercising to [employer] MinorLose Weight  Exercising can help you to lose weight. In order to lose weight through exercise, you need to do vigorous-intensity exercise. You can tell that you are exercising with vigorous intensity if you are breathing very hard and fast and cannot hold a conversation while exercising.  Moderate-intensity exercise helps to maintain your current weight. You can tell that you are exercising at a moderate level if you have a higher heart rate and faster breathing, but you are still able to hold a conversation.  How often should I exercise?  Choose an activity that you enjoy and set realistic goals. Your health

## 2017-01-13 NOTE — Patient Instructions
tell that you are exercising at a moderate level if you have a higher heart rate and faster breathing, but you are still able to hold a conversation.  How often should I exercise?  Choose an activity that you enjoy and set realistic goals. Your health care provider can help you to make an activity plan that works for you. Exercise regularly as directed by your health care provider. This may include:  ? Doing resistance training twice each week, such as:  ? Push-ups.  ? Sit-ups.  ? Lifting weights.  ? Using resistance bands.  ? Doing a given intensity of exercise for a given amount of time. Choose from these options:  ? 150 minutes of moderate-intensity exercise every week.  ? 75 minutes of vigorous-intensity exercise every week.  ? A mix of moderate-intensity and vigorous-intensity exercise every week.  Children, pregnant women, people who are out of shape, people who are overweight, and older adults may need to consult a health care provider for individual recommendations. If you have any sort of medical condition, be sure to consult your health care provider before starting a new exercise program.  What are some activities that can help me to lose weight?  ? Walking at a rate of at least 4.5 miles an hour.  ? Jogging or running at a rate of 5 miles per hour.  ? Biking at a rate of at least 10 miles per hour.  ? Lap swimming.  ? Roller-skating or in-line skating.  ? Cross-country skiing.  ? Vigorous competitive sports, such as football, basketball, and soccer.  ? Jumping rope.  ? Aerobic dancing.  How can I be more active in my day-to-day activities?  ? Use the stairs instead of the elevator.  ? Take a walk during your lunch break.  ? If you drive, park your car farther away from work or school.  ? If you take public transportation, get off one stop early and walk the rest of the way.  ? Make all of your phone calls while standing up and walking around.

## 2017-01-14 ENCOUNTER — Ambulatory Visit: Admit: 2017-01-14 | Discharge: 2017-01-14 | Attending: Internal Medicine | Primary: Internal Medicine

## 2017-01-14 DIAGNOSIS — R030 Elevated blood-pressure reading, without diagnosis of hypertension: Principal | ICD-10-CM

## 2017-01-14 DIAGNOSIS — Z87891 Personal history of nicotine dependence: Secondary | ICD-10-CM

## 2017-01-14 DIAGNOSIS — F259 Schizoaffective disorder, unspecified: Secondary | ICD-10-CM

## 2017-01-14 DIAGNOSIS — Z791 Long term (current) use of non-steroidal anti-inflammatories (NSAID): Secondary | ICD-10-CM

## 2017-01-14 DIAGNOSIS — Z79899 Other long term (current) drug therapy: Secondary | ICD-10-CM

## 2017-01-14 NOTE — Progress Notes
Teaching/Attestation Statement  I saw and evaluated the patient with the resident. I was present in the room for the key portions of the exam including the HPI and mental status exam. I discussed the treatment recommendations with the patient and answered additional questions. I reviewed the resident's note and agree with the assessment and plan. See resident's note for details.

## 2017-01-14 NOTE — Progress Notes
Neurological: He is alert and oriented to person, place, and time.   Skin: Skin is warm and dry.        CBC (with or without Differential):   Lab Results   Component Value Date    WBC 5.6 04/14/2016    HGB 12.7 (L) 04/14/2016    HCT 38.6 04/14/2016    MCV 81.3 04/14/2016    MCH 26.7 (L) 04/14/2016    MCHC 32.9 04/14/2016    RDW 14.1 04/14/2016    PLATCOUNT 311 04/14/2016    MPV 8.9 04/14/2016    NEUTROPCT 57.4 04/14/2016    LYMPHPCT 34.9 04/14/2016    MONOPCT 5.9 04/14/2016    EOSPCT 1.4 04/14/2016    BASOPCT 0.4 04/14/2016   , Hepatic Function:   Lab Results   Component Value Date    ALB 4.2 10/03/2014    ALKPHOS 100 10/03/2014    ALT 15 10/03/2014    AST 15 10/03/2014    TBILI 0.5 10/03/2014   , Renal Function:   Lab Results   Component Value Date    ALB 4.2 10/03/2014    BUN 13 04/14/2016    CALCIUM 9.8 04/14/2016    CL 104 04/14/2016    CO2 26 04/14/2016    CREATININE 1.20 04/14/2016    GLU 113 (H) 04/14/2016    K 4.2 04/14/2016    NA 140 04/14/2016   , Lipid Profile    Lab Results   Component Value Date/Time    CHOL 199 04/14/2016 07:59 AM    TRIG 107 04/14/2016 07:59 AM    HDL 43 04/14/2016 07:59 AM    LDL 135 (H) 04/14/2016 07:59 AM   , Urinalysis:   Lab Results   Component Value Date    PHUR 6.5 04/14/2016    BILIRUBINUR NEGATIVE 04/14/2016    GLUCOSEU NEGATIVE 04/14/2016    HGBUR NEGATIVE 04/14/2016    WBCU NONE SEEN 04/14/2016    SQUEPI NONE SEEN 04/14/2016    HYACAST NONE SEEN 04/14/2016   , Thyroid Studies    Lab Results   Component Value Date    TSH 3.43 04/14/2016    and HGBA1C:    Lab Results   Component Value Date/Time    HGBA1C 5.0 04/14/2016 07:59 AM       Assessment:       ICD-10-CM ICD-9-CM    1. Elevated blood pressure reading without diagnosis of hypertension R03.0 796.2     Notified by psychiarist to be evaluated; advised to keep a home blood pressure log for now. Not on bp meds currently though took in past. F/u with PCP     Plan:

## 2017-01-14 NOTE — Progress Notes
?   Vitamin D 2000 UNITS Capsule Take 1 caplet by mouth daily.     No current facility-administered medications on file prior to visit.      No Known Allergies      Review of Systems  Review of Systems   Respiratory: Negative for chest tightness and shortness of breath.    Cardiovascular: Negative for chest pain, palpitations and leg swelling.           Objective:        VITAL SIGNS (all recorded)      Clinic Vitals     Row Name 01/14/17 1123                Amb Encounter Vitals    Weight 101.6 kg (224 lb)    -RS at 01/14/17 1125       Height 1.829 m (6')    -RS at 01/14/17 1125       BMI (Calculated) 30.44    -RS at 01/14/17 1125       BSA (Calculated - sq m) 2.27    -RS at 01/14/17 1125       BP 140/81    -RS at 01/14/17 1125       BP Location Right upper arm    -RS at 01/14/17 1125       Position Sitting    -RS at 01/14/17 1125       Pulse 99    -RS at 01/14/17 1125       Pulse Source Monitor    -RS at 01/14/17 1125       Pulse Quality Normal    -RS at 01/14/17 1125       Resp 18    -RS at 01/14/17 1125       Respiration Quality Normal    -RS at 01/14/17 1125       Temp 36.5 ?C (97.7 ?F)    -RS at 01/14/17 1125       Temperature Source Oral    -RS at 01/14/17 1125       Pain Score Zero    -RS at 01/14/17 1125          Education/Communication Barriers?    Learning/Communication Barriers? No    -RS at 01/14/17 1125          Fall Risk Assessment    Had recent fall / Last 6 months? No recent fall    -RS at 01/14/17 1125       Does patient have a fear of falling? No    -RS at 01/14/17 1125         User Key  (r) = Recorded By, (t) = Taken By, (c) = Cosigned By    Initials Name Effective Dates    RS Hardie ShackletonSinclair, Randall, MA 08/13/15 -         Physical Exam   Constitutional: He is oriented to person, place, and time. He appears well-developed and well-nourished.   Cardiovascular: Normal rate and regular rhythm.    Pulmonary/Chest: Effort normal and breath sounds normal. No respiratory distress.   Abdominal: Soft.

## 2017-01-14 NOTE — Progress Notes
Subjective:   Seth Chase is a 51 y.o. male being seen today for Follow-up       Advised by psychiatrist to get seen to check blood pressure as readings seemed high. Reviewed trends and mildly elevated. Discussed keeping bp log at home and bringing it in for further evaluation.        Past Medical History:   Diagnosis Date   ? Schizoaffective disorder      Past Surgical History:   Procedure Laterality Date   ? COLONOSCOPY W/ OR W/O BIOPSY N/A 11/05/2015   ? COLONOSCOPY W/ OR W/O BIOPSY N/A 01/07/2016    COLONOSCOPY W/ OR W/O BIOPSY performed by Lenis NoonMalespin, Miguel, [facility]   ? KNEE ARTHROSCOPY Right     Repaired Ligaments   ? WRIST SURGERY Right 2012     No family history on file.  Social History     Social History   ? Marital status: Single     Spouse name: N/A   ? Number of children: N/A   ? Years of education: N/A     Occupational History   ? Not on file.     Social History Main Topics   ? Smoking status: Former Smoker     Packs/day: 1.00     Years: 5.00     Types: Cigarettes     Quit date: 05/05/2013   ? Smokeless tobacco: Never Used   ? Alcohol use No   ? Drug use: No   ? Sexual activity: Not on file     Other Topics Concern   ? Not on file     Social History Narrative   ? No narrative on file     Current Outpatient Prescriptions on File Prior to Visit   Medication Sig   ? Cyanocobalamin (VITAMIN B 12 PO) Take 1 tablet by mouth daily.   ? FLUoxetine (PROzac) 40 MG PO Capsule Take 1 capsule by mouth daily.   ? fluPHENAZine (PROLIXIN) 10 MG PO Tablet Take 1 tablet by mouth 2 times daily.   ? GARLIC PO Take 1 tablet by mouth daily.   ? ibuprofen (ADVIL,MOTRIN) 600 MG Tablet TAKE 1 TABLET BY MOUTH EVERY 8 HOURS AS NEEDED FOR PAIN   ? PEG-3350 and electrolytes (GoLYTELY) 236 G Solution Reconstituted Use as directed   ? QUEtiapine (SEROquel) 400 MG PO Tablet Take 2 tablets by mouth nightly at bedtime.   ? traZODone (DESYREL) 50 MG PO Tablet TAKE 1 TABLET BY MOUTH EVERY NIGHT AT BEDTIME

## 2017-01-14 NOTE — Progress Notes
Health Maintenance was reviewed. The patient's HM Topic list was:                                            Health Maintenance   Topic Date Due   ? USPSTF HIV Risk Assessment  08/11/1978   ? Preventive Wellness Visit  08/10/1981   ? DTaP,Tdap,and Td Vaccines (1 - Tdap) 08/11/1982   ? Zoster Vaccine (1 of 2) 08/10/2013   ? Influenza Vaccine (1) 01/09/2017   ? Lipid Profile  04/14/2021   ? Colon Cancer Screening  01/06/2026   ? USPSTF Hepatitis C Screening  Completed     Return in about 2 months (around 03/16/2017) for Dr Jenna LuoKevin Green.

## 2017-02-23 ENCOUNTER — Ambulatory Visit: Attending: Psychiatry | Primary: Internal Medicine

## 2017-02-23 DIAGNOSIS — F251 Schizoaffective disorder, depressive type: Principal | ICD-10-CM

## 2017-02-23 NOTE — Progress Notes
Teaching/Attestation Statement  I saw and evaluated the patient with the resident. I was present in the room for the key portions of the exam including the HPI and mental status exam. I discussed the treatment recommendations with the patient and answered additional questions. I reviewed the resident's note and agree with the assessment and plan. See resident's note for details.

## 2017-02-23 NOTE — Progress Notes
Self injurious behaviors: none reported.  History of violence or abuse: none reported.    Family History:    No Mental illness in family or Substance abuse in family    Social History   Born in [place]; raised by mom and [employer]; 8 siblings (baby of the family).  Highest education level 8th grade.  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  [place] to [place] in [place]; resides with brother [name] and his son 19yo [name].  1 gun in locked safe.  No legal issues.  [employer].     Substance Abuse History   Tobacco: quit 2015; previously 1PPD for 3 years  No alcohol or drug abuse reported.    Past Medical History:     Patient Active Problem List   Diagnosis   ? Schizoaffective disorder (CMS-HCC code)   ? Special screening for malignant neoplasms, colon   ? History of colon polyps   ? Elevated blood pressure reading without diagnosis of hypertension     Allergies:   Patient has no known allergies.    Current Home Medications:     Current Outpatient Medications    Medication Sig Start Date End Date Taking? Authorizing Provider   Cyanocobalamin (VITAMIN B 12 PO) Take 1 tablet by mouth daily.    Information, Historical   FLUoxetine (PROzac) 40 MG PO Capsule Take 1 capsule by mouth daily. 01/13/17   Zonia KiefLeung, Kitty, MD   fluPHENAZine (PROLIXIN) 10 MG PO Tablet Take 1 tablet by mouth 2 times daily. 01/13/17   Zonia KiefLeung, Kitty, MD   GARLIC PO Take 1 tablet by mouth daily.    Information, Historical   ibuprofen (ADVIL,MOTRIN) 600 MG Tablet TAKE 1 TABLET BY MOUTH EVERY 8 HOURS AS NEEDED FOR PAIN 11/05/15   Wallis MartGreen, Kevin Roy, MD   PEG-3350 and electrolytes (GoLYTELY) 236 G Solution Reconstituted Use as directed 10/24/15   Caryl AspNasrallah, Victor N, PA-C   QUEtiapine (SEROquel) 400 MG PO Tablet Take 2 tablets by mouth nightly at bedtime. 01/13/17   Zonia KiefLeung, Kitty, MD   traZODone (DESYREL) 50 MG PO Tablet TAKE 1 TABLET BY MOUTH EVERY NIGHT AT BEDTIME 01/13/17   Zonia KiefLeung, Kitty, MD

## 2017-02-23 NOTE — Progress Notes
management, and supportive counseling.  Patient participated in discussion, asked appropriate questions, and agreed to the plan discussed above.    Patient discussed with attending physician, Dr. Ladona Ridgelaylor, who was present for key portions of exam and agrees with the above assessment and plan.    Zonia KiefKitty Leung, MD, PGY-3  02/23/2017 11:14 AM  [facility] Department of Psychiatry

## 2017-02-23 NOTE — Progress Notes
Mental Status Examination:   General/Appearance: 51 y.o. African American male who appears stated age, casually dressed.    Behavior: cooperative, engaged, pleasant, friendly, calm  Eye Contact: appropriate.  Psychomotor: without apparent psychomotor agitation or retardation, no abnormal movements noted.  Musculoskeletal: ambulates without difficulty  Speech/Language: fluent with normal rate, volume, and monotone.  Mood: "good"   Affect: congruent, appropriate for content, stable, normal range, euthymic.  Thought process: linear, coherent, goal directed, with no loose associations.    Thought content: some paranoid ideations present.  Orientation: person, place, situation, date, day of week, month, year.  Memory: remote and recent memory grossly intact.  Attention/Concentration: average.  Fund of knowledge: average.  Judgment: fair.  Insight: fair.    DATA:   No new data  8/3: PHQ 9 total score: 17  And GAD 7 total score: 8 (reviewed and scanned)  4/2:PHQ-9 total score: 14 (score 10-14: moderate depression; significant for "more than half the days": depressed; insomnia; low energy; feel bad about self; poor concentration)    Diagnosis:   Axis I: Schizoaffective disorder; depressed type   Axis II: Deferred   Axis III: none   Axis IV: chronic mental illness   Axis V: 50-55    Prognosis: Fair for recovery.    Plan:   Seth Chase will receive medication management at the [facility] Department of Psych[facility].    Attempted to call brother Seth Fearing(James) @ [phone] to update him w/ med changes- but no answer.  Will try again later.   Pt did not need refills on meds- his brother just picked them up and he has one additional refill.  Went over med changes with him and he voiced understanding.     Medication Recommendations:   - Continue Seroquel 800mg  target residual psychosis/paranoia and insomnia (encouraged to diet and exercise; side effect pf med: weight gain and sedation)

## 2017-02-23 NOTE — Patient Instructions
-   Continue Seroquel 800mg  target residual psychosis/paranoia and insomnia (encouraged to diet and exercise; side effect: weight gain)    - DECREASE Fluphenazine to 10mg  twice a day (once in am and once in pm... with plan to discontinue)  - Discussed risks vs benefit of being on two antipsychotics; and goal to be on just one antipsychotic to control his symptoms.     - Continue Fluoxetine 40mg  in am  for depression    - Continue Trazodone 50mg  nightly AS NEEDED for insomnia

## 2017-02-23 NOTE — Progress Notes
-   DECREASE Fluphenazine 10mg  am and 20mg  pm to 10mg  BID to target psychosis (plan to titrate OFF)  - Discussed risks vs benefit of being on two antipsychotics; and goal to be on just one antipsychotic to control his symptoms.   - Continue Fluoxetine 40mg  daily for depression  - Continue Trazodone 50mg  PRN for insomnia  - follow up with pcp in regards to blood pressure  -Medication side effect profiles, risks, and benefits were discussed with the patient.  - Patient encouraged to contact the clinic if experiencing any adverse reactions with medications.      I reviewed with the patient the potential risks of this medication (including metabolic side effects as well as risk of sudden death in the elderly), as well as the potential benefits (improving mood or psychotic symptoms).  I also presented alternatives including no medication.  The patient conveyed understanding by asking appropriate questions and provided informed consent. The patient was provided with an information sheet reviewing a more complete list of adverse effects.    I reviewed with the patient the potential risks of this medication (including increase is suicidal ideation, GI upset, and sexual side effects), as well as the potential benefits (improving depressive/anxious symptoms).  I also presented alternatives including no medication and therapy alone.  The patient conveyed understanding by asking appropriate questions and provided informed consent. The patient was provided with an information sheet reviewing a more complete list of adverse effects.     Other Recommendations:   - If patient has any concerns for their own safety, due to adverse reactions to medications, suicidal or homicidal ideations, auditory or visual hallucinations, or delusions, they have been told to call 911 or go to the nearest emergency department.    RTC: 4-6 weeks     30 minutes were spent with the patient for psychoeducation, medication

## 2017-02-23 NOTE — Progress Notes
Vitamin D 2000 UNITS Capsule Take 1 caplet by mouth daily.    Information, Historical     Review of systems:    Constitutional: denies weight gain; denies fatigue  HEENT: denies rhinorrhea, sore throat.  Respiratory: denies cough, shortness of breath  Cardiovascular: denies chest pain  GI: denies N/V/C/D, abdominal pain.  MSK: denies joint pain, muscle stiffness/soreness, back pain, neck pain.  Neuro: denies HA, dizziness.  Psychiatric: as above and below.    Vital Signs:    BP (!) 143/97  - Pulse 90  - Temp 36.4 ?C (97.5 ?F)  - Resp 16  - Ht 1.829 m (6')  - Wt 101.3 kg (223 lb 6.4 oz)  - BMI 30.30 kg/m?      Last Wt, Ht, and BMI    Wt Readings from Last 3 Encounters:   02/23/17 101.3 kg (223 lb 6.4 oz)   01/14/17 101.6 kg (224 lb)   01/13/17 103 kg (227 lb)     Ht Readings from Last 3 Encounters:   02/23/17 1.829 m (6')   01/14/17 1.829 m (6')   01/13/17 1.829 m ([place] age limit for growth percentiles is 20 years.    Lab Results:      CBC (with or without Differential):   Lab Results   Component Value Date    WBC 5.6 04/14/2016    HGB 12.7 (L) 04/14/2016    HCT 38.6 04/14/2016    MCV 81.3 04/14/2016    MCH 26.7 (L) 04/14/2016    MCHC 32.9 04/14/2016    RDW 14.1 04/14/2016    PLATCOUNT 311 04/14/2016    MPV 8.9 04/14/2016    NEUTROPCT 57.4 04/14/2016    LYMPHPCT 34.9 04/14/2016    MONOPCT 5.9 04/14/2016    EOSPCT 1.4 04/14/2016    BASOPCT 0.4 04/14/2016    and BMP/CMP:   Lab Results   Component Value Date    NA 140 04/14/2016    K 4.2 04/14/2016    CL 104 04/14/2016    CO2 26 04/14/2016    BUN 13 04/14/2016    CREATININE 1.20 04/14/2016    GLU 113 (H) 04/14/2016    CALCIUM 9.8 04/14/2016    ALB 4.2 10/03/2014    AST 15 10/03/2014    ALT 15 10/03/2014    EGFR 69 04/14/2016    TBILI 0.5 10/03/2014    ALKPHOS 100 10/03/2014     Lab Results   Component Value Date    TRIG 107 04/14/2016    HDL 43 04/14/2016    LDL 135 (H) 04/14/2016    CHOL 199 04/14/2016    HGBA1C 5.0 04/14/2016

## 2017-02-23 NOTE — Progress Notes
[facility] Department of Psychiatry    Patient Name: Seth Chase  Patient DOB: 10/25/1965  Patient MRN: [id number]  Primary Language: Lenox PondsEnglish    Provider: Zonia KiefKitty Leung, MD  Date of Service: 02/23/2017  Type of Service: follow up/med management  CPT Code: 8295699214 - Moderate complexity med. mgt. visit (some med changes, 30 min)   Insurance: Medicare [employer] transport [employer]    Chief Complaint: "medication management. "    Interval History:   Seth Chase is a 51 y.o. AA male who resides with brother Seth Chase(Seth Chase) and nephew in [place]. Has past psychiatric history significant for Schizoaffective disorder, depressed type.  Pt presents for follow up/med management.  Was previously seeing Bennie Hindhris Calandro, ARNP since 10/2013.  Last saw this provider on 01/14/2017 and decreased Prolixin from 30mg  TDD to 10mg  BID (with plan to taper off) and  continued Seroquel 800 mg,  Prozac 40mg , and Trazodone 50mg  prn.      Since last visit overall doing well with no acute complaints.  Continues to have residual psychosis and paranoia. Doesn't like crowded place.  Did NOT decrease his Fluphenazine to 10mg  BID from last visit.   Has been exercising with stationary bike and walking his dog ([name]).  Enjoys going to church and farmers market. Good relationship with brother.  [place] see family.  Talks to him sisters daily (they reside in [place]).    No issues with sleep (w/ Trazodone prn) and appetite.       No signs of mania, hypomania, not observed responding to internal stimuli.  Denies SI/HI.  Is adherent with psychotropic medications with no adverse effects.  Brother helps with his medications.      Psychiatric History   Inpatient psychiatric [facility]izations: [facility] [place]; 9 years ago  Previous outpatient psychiatric treatment: [facility] in [place]  Prior therapy: none  Prior psychiatric medication trials:  Cannot recall the names  History of suicide attempts: hx of attempt 15-20 years ago

## 2017-03-24 ENCOUNTER — Ambulatory Visit: Attending: Psychiatry | Primary: Internal Medicine

## 2017-03-24 DIAGNOSIS — F251 Schizoaffective disorder, depressive type: Principal | ICD-10-CM

## 2017-03-24 MED ORDER — QUETIAPINE FUMARATE 400 MG PO TABS
800 mg | Freq: Every evening | ORAL | 1 refills | Status: CP
Start: 2017-03-24 — End: 2017-04-20

## 2017-03-24 MED ORDER — TRAZODONE HCL 50 MG PO TABS
1 refills | Status: CP
Start: 2017-03-24 — End: 2017-04-20

## 2017-03-24 NOTE — Progress Notes
script; plan to titrate OFF; was previously on TDD 30mg )  - Discussed risks vs benefit of being on two antipsychotics; and goal to be on just one antipsychotic to control his symptoms.   - Continue Fluoxetine 40mg  daily for depression  - Continue Trazodone 50mg  PRN for insomnia  - follow up with pcp in regards to blood pressure  -Medication side effect profiles, risks, and benefits were discussed with the patient.  - Patient encouraged to contact the clinic if experiencing any adverse reactions with medications.      I reviewed with the patient the potential risks of this medication (including metabolic side effects as well as risk of sudden death in the elderly), as well as the potential benefits (improving mood or psychotic symptoms).  I also presented alternatives including no medication.  The patient conveyed understanding by asking appropriate questions and provided informed consent. The patient was provided with an information sheet reviewing a more complete list of adverse effects.    I reviewed with the patient the potential risks of this medication (including increase is suicidal ideation, GI upset, and sexual side effects), as well as the potential benefits (improving depressive/anxious symptoms).  I also presented alternatives including no medication and therapy alone.  The patient conveyed understanding by asking appropriate questions and provided informed consent. The patient was provided with an information sheet reviewing a more complete list of adverse effects.     Other Recommendations:   - If patient has any concerns for their own safety, due to adverse reactions to medications, suicidal or homicidal ideations, auditory or visual hallucinations, or delusions, they have been told to call 911 or go to the nearest emergency department.    RTC: 4-6 weeks     30 minutes were spent with the patient for psychoeducation, medication management, and supportive counseling.  Patient participated in

## 2017-03-24 NOTE — Progress Notes
TBILI 0.5 10/03/2014    ALKPHOS 100 10/03/2014     Lab Results   Component Value Date    TRIG 107 04/14/2016    HDL 43 04/14/2016    LDL 135 (H) 04/14/2016    CHOL 199 04/14/2016    HGBA1C 5.0 04/14/2016     Mental Status Examination:   General/Appearance: 51 y.o. African American male who appears stated age, casually dressed.    Behavior: cooperative, engaged, pleasant, friendly, calm  Eye Contact: appropriate.  Psychomotor: without apparent psychomotor agitation or retardation, no abnormal movements noted.  Musculoskeletal: ambulates without difficulty  Speech/Language: fluent with normal rate, volume, and monotone.  Mood: "good"   Affect: congruent, appropriate for content, stable, normal range, euthymic.  Thought process: linear, coherent, goal directed, with no loose associations.    Thought content: some paranoid ideations present.  Orientation: person, place, situation, date, day of week, month, year.  Memory: remote and recent memory grossly intact.  Attention/Concentration: average.  Fund of knowledge: average.  Judgment: fair.  Insight: fair.    DATA:   No new data  8/3: PHQ 9 total score: 17  And GAD 7 total score: 8 (reviewed and scanned)  4/2:PHQ-9 total score: 14 (score 10-14: moderate depression; significant for "more than half the days": depressed; insomnia; low energy; feel bad about self; poor concentration)    Diagnosis:   Axis I: Schizoaffective disorder; depressed type   Axis II: Deferred   Axis III: none   Axis IV: chronic mental illness   Axis V: 50-55    Prognosis: Fair for recovery.    Plan:   Seth Chase will receive medication management at the [facility].    Medication Recommendations:   - Continue Seroquel 800mg  target residual psychosis/paranoia and insomnia (encouraged to diet and exercise; side effect pf med: weight gain and sedation)  - DECREASE Fluphenazine to 10mg  po daily to target psychosis (didn't need

## 2017-03-24 NOTE — Progress Notes
discussion, asked appropriate questions, and agreed to the plan discussed above.    Patient discussed with attending physician, Dr. Ladona Ridgelaylor, who was present for key portions of exam and agrees with the above assessment and plan.    Zonia KiefKitty Leung, MD, PGY-3  03/24/2017 11:06 AM  [facility]

## 2017-03-24 NOTE — Progress Notes
Previous outpatient psychiatric treatment: [facility] in [place]  Prior therapy: none  Prior psychiatric medication trials:  Cannot recall the names  History of suicide attempts: hx of attempt 15-20 years ago  Self injurious behaviors: none reported.  History of violence or abuse: none reported.    Family History:    No Mental illness in family or Substance abuse in family    Social History   [place]; raised by mom and [employer]; 8 siblings (baby of the family).  Highest education level 8th grade.  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  [place]oved [place] in [place]; resides with brother [name] and his son 19yo [name].  1 gun in locked safe.  No legal issues.  [employer].     Substance Abuse History   Tobacco: quit 2015; previously 1PPD for 3 years  No alcohol or drug abuse reported.    Past Medical History:     Patient Active Problem List   Diagnosis   ? Schizoaffective disorder (CMS-HCC code)   ? Special screening for malignant neoplasms, colon   ? History of colon polyps   ? Elevated blood pressure reading without diagnosis of hypertension     Allergies:   Patient has no known allergies.    Current Home Medications:     Current Outpatient Medications    Medication Sig Start Date End Date Taking? Authorizing Provider   Cyanocobalamin (VITAMIN B 12 PO) Take 1 tablet by mouth daily.    Information, Historical   FLUoxetine (PROzac) 40 MG PO Capsule Take 1 capsule by mouth daily. 01/13/17   Zonia KiefLeung, Kitty, MD   fluPHENAZine (PROLIXIN) 10 MG PO Tablet Take 1 tablet by mouth 2 times daily. 01/13/17   Zonia KiefLeung, Kitty, MD   GARLIC PO Take 1 tablet by mouth daily.    Information, Historical   ibuprofen (ADVIL,MOTRIN) 600 MG Tablet TAKE 1 TABLET BY MOUTH EVERY 8 HOURS AS NEEDED FOR PAIN 11/05/15   Wallis MartGreen, Kevin Roy, MD   PEG-3350 and electrolytes (GoLYTELY) 236 G Solution Reconstituted Use as directed 10/24/15   Caryl AspNasrallah, Victor N, PA-C

## 2017-03-24 NOTE — Progress Notes
Teaching/Attestation Statement  I saw and evaluated the patient with the resident. I was present in the room for the key portions of the exam including the HPI and mental status exam. I discussed the treatment recommendations with the patient and answered additional questions. I reviewed the resident's note and agree with the assessment and plan. See resident's note for details.

## 2017-03-24 NOTE — Progress Notes
QUEtiapine (SEROquel) 400 MG PO Tablet Take 2 tablets by mouth nightly at bedtime. 01/13/17   Sharyne Richters, MD   traZODone (DESYREL) 50 MG PO Tablet TAKE 1 TABLET BY MOUTH EVERY NIGHT AT BEDTIME 01/13/17   Sharyne Richters, MD   Vitamin D 2000 UNITS Capsule Take 1 caplet by mouth daily.    Information, Historical     Review of systems:    Constitutional: denies weight gain; denies fatigue  HEENT: denies rhinorrhea, sore throat.  Respiratory: denies cough, shortness of breath  Cardiovascular: denies chest pain  GI: denies N/V/C/D, abdominal pain.  MSK: denies joint pain, muscle stiffness/soreness, back pain, neck pain.  Neuro: denies HA, dizziness.  Psychiatric: as above and below.    Vital Signs:    BP 146/85  - Pulse 91  - Temp 37.3 ?C (99.2 ?F)  - Resp 18  - Ht 1.829 m (6')  - Wt 102.2 kg (225 lb 6.4 oz)  - BMI 30.57 kg/m?      Last Wt, Ht, and BMI    Wt Readings from Last 3 Encounters:   03/24/17 102.2 kg (225 lb 6.4 oz)   02/23/17 101.3 kg (223 lb 6.4 oz)   01/14/17 101.6 kg (224 lb)     Ht Readings from Last 3 Encounters:   03/24/17 1.829 m (6')   02/23/17 1.829 m (6')   01/14/17 1.829 m ([place] age limit for growth percentiles is 20 years.    Lab Results:      CBC (with or without Differential):   Lab Results   Component Value Date    WBC 5.6 04/14/2016    HGB 12.7 (L) 04/14/2016    HCT 38.6 04/14/2016    MCV 81.3 04/14/2016    MCH 26.7 (L) 04/14/2016    MCHC 32.9 04/14/2016    RDW 14.1 04/14/2016    PLATCOUNT 311 04/14/2016    MPV 8.9 04/14/2016    NEUTROPCT 57.4 04/14/2016    LYMPHPCT 34.9 04/14/2016    MONOPCT 5.9 04/14/2016    EOSPCT 1.4 04/14/2016    BASOPCT 0.4 04/14/2016    and BMP/CMP:   Lab Results   Component Value Date    NA 140 04/14/2016    K 4.2 04/14/2016    CL 104 04/14/2016    CO2 26 04/14/2016    BUN 13 04/14/2016    CREATININE 1.20 04/14/2016    GLU 113 (H) 04/14/2016    CALCIUM 9.8 04/14/2016    ALB 4.2 10/03/2014    AST 15 10/03/2014    ALT 15 10/03/2014    EGFR 69 04/14/2016

## 2017-03-24 NOTE — Progress Notes
[facility] Psychiatry    Patient Name: Seth Chase  Patient DOB: 10/04/1965  Patient MRN: [id number]  Primary Language: Lenox PondsEnglish    Provider: Zonia KiefKitty Leung, MD  Date of Service: 03/24/2017  Type of Service: follow up/med management  CPT Code: 5409899214 - Moderate complexity med. mgt. visit (some med changes, 30 min)   Insurance: [employer]enger transport [employer]    Chief Complaint: "medication management. "    Interval History:   Seth RevealJoe Grothaus is a 51 y.o. AA male who resides with brother [name] and nephew Systems analyst(Micheal) in [place]. Has past psychiatric history significant for Schizoaffective disorder, depressed type.  Pt presents for follow up/med management.  Was previously seeing Bennie Hindhris Calandro, ARNP since 10/2013.  Last saw this provider on 02/23/2017 and decreased Prolixin 10mg  BID (with plan to taper off) and  continued Seroquel 800 mg,  Prozac 40mg , and Trazodone 50mg  prn.      Since last visit overall doing well with no acute complaints.  Continues to have residual psychosis and paranoia.  No worsening of symptoms since lowering prolixin to 10mg  BID.  Mood is stable.  Endorses sone depressive symptoms for the past three weeks- unsure why.  Denies recent stressors/triggers.  Rates depression 6/10.  Talks to sister Natalia Leatherwood(Katherine) daily.  Good emotional support from family.  He previously resided with Natalia [place]atherine in [place] before he moved to [place] 4 years ago.  [place].  Walks [name] daily in the am and uses the stationary bike every other day for 30 minutes.   No issues with sleep (w/ Trazodone prn; 8 hours total) and appetite. Pt brought prescription bottles on today's visit and we reviewed them together.      No signs of mania, hypomania, not observed responding to internal stimuli.  Denies SI/HI.  Is adherent with psychotropic medications with no adverse effects.     Psychiatric History   Inpatient psychiatric hospitalizations: [facility]x at [facility] [place]; 9 years ago

## 2017-03-29 ENCOUNTER — Ambulatory Visit: Admit: 2017-03-29 | Discharge: 2017-03-29 | Attending: Internal Medicine | Primary: Internal Medicine

## 2017-03-29 DIAGNOSIS — F251 Schizoaffective disorder, depressive type: Secondary | ICD-10-CM

## 2017-03-29 DIAGNOSIS — Z0000 Encounter for general adult medical examination without abnormal findings: Secondary | ICD-10-CM

## 2017-03-29 DIAGNOSIS — M25562 Pain in left knee: Secondary | ICD-10-CM

## 2017-03-29 DIAGNOSIS — M25561 Pain in right knee: Secondary | ICD-10-CM

## 2017-03-29 DIAGNOSIS — Z23 Encounter for immunization: Principal | ICD-10-CM

## 2017-03-29 DIAGNOSIS — Z87891 Personal history of nicotine dependence: Secondary | ICD-10-CM

## 2017-03-29 DIAGNOSIS — R030 Elevated blood-pressure reading, without diagnosis of hypertension: Secondary | ICD-10-CM

## 2017-03-29 MED ORDER — ZOSTER VAC RECOMB ADJUVANTED 50 MCG/0.5ML IM SUSR
.5 mL | Freq: Once | INTRAMUSCULAR | 0 refills | Status: CP
Start: 2017-03-29 — End: ?

## 2017-03-30 DIAGNOSIS — F259 Schizoaffective disorder, unspecified: Principal | ICD-10-CM

## 2017-04-20 ENCOUNTER — Ambulatory Visit: Attending: Psychiatry | Primary: Internal Medicine

## 2017-04-20 DIAGNOSIS — F251 Schizoaffective disorder, depressive type: Principal | ICD-10-CM

## 2017-04-20 MED ORDER — FLUOXETINE HCL 40 MG PO CAPS
40 mg | Freq: Every day | ORAL | 1 refills | Status: CP
Start: 2017-04-20 — End: 2017-06-08

## 2017-04-20 MED ORDER — FLUPHENAZINE HCL 10 MG PO TABS
10 mg | Freq: Every day | ORAL | 0 refills | Status: CP
Start: 2017-04-20 — End: 2017-06-08

## 2017-04-20 MED ORDER — TRAZODONE HCL 50 MG PO TABS
1 refills | Status: CP
Start: 2017-04-20 — End: 2017-06-08

## 2017-04-20 MED ORDER — QUETIAPINE FUMARATE 400 MG PO TABS
800 mg | Freq: Every evening | ORAL | 1 refills | Status: CP
Start: 2017-04-20 — End: 2017-06-08

## 2017-04-20 NOTE — Progress Notes
WBC 6.7 04/09/2017    HGB 12.6 (L) 04/09/2017    HCT 38.6 04/09/2017    MCV 79.6 (L) 04/09/2017    MCH 26.0 (L) 04/09/2017    MCHC 32.6 04/09/2017    RDW 13.3 04/09/2017    PLATCOUNT 390 04/09/2017    MPV 9.1 04/09/2017    NEUTROPCT 66.5 04/09/2017    LYMPHPCT 27.1 04/09/2017    MONOPCT 5.5 04/09/2017    EOSPCT 0.6 04/09/2017    BASOPCT 0.3 04/09/2017    and BMP/CMP:   Lab Results   Component Value Date    NA 137 04/09/2017    K 5.0 04/09/2017    CL 101 04/09/2017    CO2 26 04/09/2017    BUN 13 04/09/2017    CREATININE 1.44 (H) 04/09/2017    GLU 119 (H) 04/09/2017    CALCIUM 9.9 04/09/2017    ALB 4.5 04/09/2017    AST 15 04/09/2017    ALT 15 04/09/2017    EGFR 55 (L) 04/09/2017    TBILI 0.5 04/09/2017    ALKPHOS 111 04/09/2017     Lab Results   Component Value Date    TRIG 145 04/09/2017    HDL 36 (L) 04/09/2017    LDL 157 (H) 04/09/2017    CHOL 220 (H) 04/09/2017    HGBA1C 5.2 04/09/2017     Mental Status Examination:   General/Appearance: 51 y.o. African American male who appears stated age, casually dressed.    Behavior: cooperative, engaged, pleasant, friendly, calm  Eye Contact: appropriate.  Psychomotor: without apparent psychomotor agitation or retardation, no abnormal movements noted.  Musculoskeletal: ambulates without difficulty  Speech/Language: fluent with normal rate, volume, and monotone.  Mood: "good"   Affect: congruent, appropriate for content, stable, normal range, euthymic.  Thought process: linear, coherent, goal directed, with no loose associations.    Thought content: some paranoid ideations present.  Orientation: person, place, situation, date, day of week, month, year.  Memory: remote and recent memory grossly intact.  Attention/Concentration: average.  Fund of knowledge: average.  Judgment: fair.  Insight: fair.    DATA:   No new data  8/3: PHQ 9 total score: 17  And GAD 7 total score: 8 (reviewed and scanned)  4/2:PHQ-9 total score: 14 (score 10-14: moderate depression; significant

## 2017-04-20 NOTE — Progress Notes
herpes zoster vaccine recomb adjuvanted (SHINGRIX) 50 MCG/0.5ML IM Suspension Reconstituted Inject 0.5 mLs into the muscle once for 1 dose. 03/29/17 03/29/17  Wallis MartGreen, Kevin Roy, MD   ibuprofen (ADVIL,MOTRIN) 600 MG Tablet TAKE 1 TABLET BY MOUTH EVERY 8 HOURS AS NEEDED FOR PAIN 11/05/15   Wallis MartGreen, Kevin Roy, MD   PEG-3350 and electrolytes (GoLYTELY) 236 G Solution Reconstituted Use as directed 10/24/15   Caryl AspNasrallah, Victor N, PA-C   QUEtiapine (SEROquel) 400 MG PO Tablet Take 2 tablets by mouth nightly at bedtime. 04/20/17   Zonia KiefLeung, Kitty, MD   QUEtiapine (SEROquel) 400 MG PO Tablet Take 2 tablets by mouth nightly at bedtime. 03/24/17 04/20/17  Zonia KiefLeung, Kitty, MD   traZODone (DESYREL) 50 MG PO Tablet TAKE 1 TABLET BY MOUTH EVERY NIGHT AT BEDTIME 04/20/17   Zonia KiefLeung, Kitty, MD   traZODone (DESYREL) 50 MG PO Tablet TAKE 1 TABLET BY MOUTH EVERY NIGHT AT BEDTIME 03/24/17 04/20/17  Zonia KiefLeung, Kitty, MD   Vitamin D 2000 UNITS Capsule Take 1 caplet by mouth daily.    Information, Historical     Review of systems:    Constitutional: denies weight gain; denies fatigue  HEENT: denies rhinorrhea, sore throat.  Respiratory: denies cough, shortness of breath  Cardiovascular: denies chest pain  GI: denies N/V/C/D, abdominal pain.  MSK: denies joint pain, muscle stiffness/soreness, back pain, neck pain.  Neuro: denies HA, dizziness.  Psychiatric: as above and below.    Vital Signs:    BP 137/87  - Pulse 95  - Temp 36.3 ?C (97.4 ?F)  - Resp 18  - Ht 1.829 m (6')  - Wt 100.6 kg (221 lb 12.8 oz)  - BMI 30.08 kg/m?      Last Wt, Ht, and BMI    Wt Readings from Last 3 Encounters:   04/20/17 100.6 kg (221 lb 12.8 oz)   03/29/17 102.1 kg (225 lb)   03/24/17 102.2 kg (225 lb 6.4 oz)     Ht Readings from Last 3 Encounters:   04/20/17 1.829 m (6')   03/29/17 1.829 m (6')   03/24/17 1.829 m (6')     Facility age limit for growth percentiles is 20 years.    Lab Results:      CBC (with or without Differential):   Lab Results   Component Value Date

## 2017-04-20 NOTE — Progress Notes
therapy alone.  The patient conveyed understanding by asking appropriate questions and provided informed consent. The patient was provided with an information sheet reviewing a more complete list of adverse effects.     Other Recommendations:   - If patient has any concerns for their own safety, due to adverse reactions to medications, suicidal or homicidal ideations, auditory or visual hallucinations, or delusions, they have been told to call 911 or go to the nearest emergency department.    RTC: 6-8 weeks     30 minutes were spent with the patient for psychoeducation, medication management, and supportive counseling.  Patient participated in discussion, asked appropriate questions, and agreed to the plan discussed above.    Patient discussed with attending physician, Dr. Ladona Ridgelaylor, who was present for key portions of exam and agrees with the above assessment and plan.    Zonia KiefKitty Leung, MD, PGY-3  04/20/2017 10:24 AM  [facility]
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
## 2017-04-20 NOTE — Progress Notes
[facility]: Bennie Hindhris Calandro, ARNP since 10/2013-04/2016  Prior therapy: none  Prior psychiatric medication trials:  Cannot recall the names  History of suicide attempts: hx of attempt 15-20 years ago  Self injurious behaviors: none reported.  History of violence or abuse: none reported.    Family History:    No Mental illness in family or Substance abuse in family    Social History   Born [place]; raised by mom and [employer]; 8 siblings (baby of the family).  Highest education level 8th grade.  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  [place]; resides with brother [name] and his son 19yo [name].  1 gun in locked safe.  No legal issues.  [employer].     Substance Abuse History   Tobacco: quit 2015; previously 1PPD for 3 years  No alcohol or drug abuse reported.    Past Medical History:     Patient Active Problem List   Diagnosis   ? Schizoaffective disorder (CMS-HCC code)   ? Special screening for malignant neoplasms, colon   ? History of colon polyps   ? Elevated blood pressure reading without diagnosis of hypertension     Allergies:   Patient has no known allergies.    Current Home Medications:     Current Outpatient Medications    Medication Sig Start Date End Date Taking? Authorizing Provider   Cyanocobalamin (VITAMIN B 12 PO) Take 1 tablet by mouth daily.    Information, Historical   FLUoxetine (PROzac) 40 MG PO Capsule Take 1 capsule by mouth daily. 04/20/17   Zonia KiefLeung, Kitty, MD   FLUoxetine (PROzac) 40 MG PO Capsule Take 1 capsule by mouth daily. 01/13/17 04/20/17  Zonia KiefLeung, Kitty, MD   fluPHENAZine (PROLIXIN) 10 MG PO Tablet Take 1 tablet by mouth daily. 04/20/17   Zonia KiefLeung, Kitty, MD   fluPHENAZine (PROLIXIN) 10 MG PO Tablet Take 1 tablet by mouth 2 times daily. 01/13/17 04/20/17  Zonia KiefLeung, Kitty, MD   GARLIC PO Take 1 tablet by mouth daily.    Information, Historical

## 2017-04-20 NOTE — Progress Notes
for "more than half the days": depressed; insomnia; low energy; feel bad about self; poor concentration)    Diagnosis:   Axis I: Schizoaffective disorder; depressed type   Axis II: Deferred   Axis III: none   Axis IV: chronic mental illness   Axis V: 50-55    Prognosis: Fair for recovery.    Plan:   Seth RevealJoe Chase will receive medication management at the [facility].    Medication Recommendations:   - Continue Seroquel 800mg  target residual psychosis/paranoia and insomnia (encouraged to diet and exercise; side effect of med: weight gain and sedation)  - Continue Fluphenazine 10mg  po daily to target psychosis (plan to titrate OFF; was previously on TDD 30mg )  - Discussed risks vs benefit of being on two antipsychotics; and goal to be on just one antipsychotic to control his symptoms.   - Continue Fluoxetine 40mg  daily for depression  - Continue Trazodone 50mg  PRN for insomnia  -Medication side effect profiles, risks, and benefits were discussed with the patient.  - Patient encouraged to contact the clinic if experiencing any adverse reactions with medications.      Antipsychotics: I reviewed with the patient the potential risks of this medication (including metabolic side effects as well as risk of sudden death in the elderly), as well as the potential benefits (improving mood or psychotic symptoms).  I also presented alternatives including no medication.  The patient conveyed understanding by asking appropriate questions and provided informed consent. The patient was provided with an information sheet reviewing a more complete list of adverse effects.    SSRI: I reviewed with the patient the potential risks of this medication (including increase is suicidal ideation, GI upset, and sexual side effects), as well as the potential benefits (improving depressive/anxious symptoms).  I also presented alternatives including no medication and

## 2017-04-20 NOTE — Patient Instructions
-   Continue Seroquel 800 mg target residual psychosis/paranoia and insomnia (encouraged to diet and exercise; side effect pf med: weight gain and sedation)    - Continue Fluphenazine 10mg  po daily to target psychosis ( plan to titrate OFF; was previously on TDD 30mg )     - Continue Fluoxetine 40mg  daily for depression    - Continue Trazodone 50mg  PRN for insomnia

## 2017-04-20 NOTE — Progress Notes
Teaching/Attestation Statement  I saw and evaluated the patient with the resident. I was present in the room for the key portions of the exam including the HPI and mental status exam. I discussed the treatment recommendations with the patient and answered additional questions. I reviewed the resident's note and agree with the assessment and plan. See resident's note for details.

## 2017-06-08 ENCOUNTER — Ambulatory Visit: Attending: Psychiatry | Primary: Internal Medicine

## 2017-06-08 DIAGNOSIS — F251 Schizoaffective disorder, depressive type: Principal | ICD-10-CM

## 2017-06-08 MED ORDER — FLUPHENAZINE HCL 10 MG PO TABS
10 mg | Freq: Every day | ORAL | 1 refills | Status: CP
Start: 2017-06-08 — End: 2017-06-11

## 2017-06-08 MED ORDER — FLUOXETINE HCL 40 MG PO CAPS
40 mg | Freq: Every day | ORAL | 1 refills | Status: CP
Start: 2017-06-08 — End: 2017-06-11

## 2017-06-08 MED ORDER — QUETIAPINE FUMARATE 400 MG PO TABS
800 mg | Freq: Every evening | ORAL | 1 refills | Status: CP
Start: 2017-06-08 — End: 2017-06-11

## 2017-06-08 MED ORDER — TRAZODONE HCL 50 MG PO TABS
1 refills | Status: CP
Start: 2017-06-08 — End: 2017-06-11

## 2017-06-08 NOTE — Progress Notes
visual hallucinations, or delusions, they have been told to call 911 or go to the nearest emergency department.    RTC: 8 weeks     30 minutes were spent with the patient for psychoeducation, medication management, and supportive counseling.  Patient participated in discussion, asked appropriate questions, and agreed to the plan discussed above.    Patient discussed with attending physician, Dr. Ladona Ridgelaylor, who was present for key portions of exam and agrees with the above assessment and plan.    Zonia KiefKitty Leung, MD, PGY-3  06/08/2017 10:08 AM  [facility]

## 2017-06-08 NOTE — Progress Notes
No Mental illness in family or Substance abuse in family    Social History   [place]; raised by mom and [employer]; 8 siblings (baby of the family).  Highest education level 8th grade.  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  [place] in [place]; resides with brother [name] and his son 19yo [name].  1 gun in locked safe.  No legal issues.  [employer].     Substance Abuse History   Tobacco: quit 2015; previously 1PPD for 3 years  No alcohol or drug abuse reported.    Past Medical History:     Patient Active Problem List   Diagnosis   ? Schizoaffective disorder (CMS-HCC code)   ? Special screening for malignant neoplasms, colon   ? History of colon polyps   ? Elevated blood pressure reading without diagnosis of hypertension     Allergies:   Patient has no known allergies.    Current Home Medications:     Current Outpatient Medications    Medication Sig Start Date End Date Taking? Authorizing Provider   Cyanocobalamin (VITAMIN B 12 PO) Take 1 tablet by mouth daily.    Information, Historical   FLUoxetine (PROzac) 40 MG PO Capsule Take 1 capsule by mouth daily. 04/20/17   Zonia KiefLeung, Kitty, MD   fluPHENAZine (PROLIXIN) 10 MG PO Tablet Take 1 tablet by mouth daily. 04/20/17   Zonia KiefLeung, Kitty, MD   GARLIC PO Take 1 tablet by mouth daily.    Information, Historical   herpes zoster vaccine recomb adjuvanted (SHINGRIX) 50 MCG/0.5ML IM Suspension Reconstituted Inject 0.5 mLs into the muscle once for 1 dose. 03/29/17 03/29/17  Wallis MartGreen, Kevin Roy, MD   ibuprofen (ADVIL,MOTRIN) 600 MG Tablet TAKE 1 TABLET BY MOUTH EVERY 8 HOURS AS NEEDED FOR PAIN 11/05/15   Wallis MartGreen, Kevin Roy, MD   PEG-3350 and electrolytes (GoLYTELY) 236 G Solution Reconstituted Use as directed 10/24/15   Caryl AspNasrallah, Victor N, PA-C   QUEtiapine (SEROquel) 400 MG PO Tablet Take 2 tablets by mouth nightly at bedtime. 04/20/17   Zonia KiefLeung, Kitty, MD

## 2017-06-08 NOTE — Progress Notes
to making any psychotropic medication changes at this time.       Medication Recommendations:   - Continue Seroquel 800mg  target residual psychosis/paranoia and insomnia (encouraged to diet and exercise; side effect of med: weight gain and sedation)  - Continue Fluphenazine 10mg  po daily to target psychosis (plan to titrate OFF; was previously on TDD 30mg )  - Discussed risks vs benefit of being on two antipsychotics; and goal to be on just one antipsychotic to control his symptoms.   - Continue Fluoxetine 40mg  daily for depression  - Continue Trazodone 50mg  PRN for insomnia  -Medication side effect profiles, risks, and benefits were discussed with the patient.  - Patient encouraged to contact the clinic if experiencing any adverse reactions with medications.      Antipsychotics: I reviewed with the patient the potential risks of this medication (including metabolic side effects as well as risk of sudden death in the elderly), as well as the potential benefits (improving mood or psychotic symptoms).  I also presented alternatives including no medication.  The patient conveyed understanding by asking appropriate questions and provided informed consent. The patient was provided with an information sheet reviewing a more complete list of adverse effects.    SSRI: I reviewed with the patient the potential risks of this medication (including increase is suicidal ideation, GI upset, and sexual side effects), as well as the potential benefits (improving depressive/anxious symptoms).  I also presented alternatives including no medication and therapy alone.  The patient conveyed understanding by asking appropriate questions and provided informed consent. The patient was provided with an information sheet reviewing a more complete list of adverse effects.     Other Recommendations:   - If patient has any concerns for their own safety, due to adverse reactions to medications, suicidal or homicidal ideations, auditory or

## 2017-06-08 NOTE — Progress Notes
CHOL 220 (H) 04/09/2017    HGBA1C 5.2 04/09/2017     Mental Status Examination:   General/Appearance: 52 y.o. African American male who appears stated age, casually dressed.    Behavior: cooperative, engaged, polite,calm  Eye Contact: appropriate,  Psychomotor: without apparent psychomotor agitation or retardation, no abnormal movements noted.  Musculoskeletal: ambulates without difficulty  Speech/Language: fluent with normal rate, volume, and monotone.  Mood: "good"   Affect: full range, congruent, appropriate for content, stable, normal range, euthymic.  Thought process: linear, coherent, goal directed, with no loose associations.    Thought content: some paranoid ideations present.  Orientation: person, place, situation, date, day of week, month, year.  Memory: remote and recent memory grossly intact.  Attention/Concentration: average.  Fund of knowledge: average.  Judgment: fair.  Insight: fair.    DATA:   No new data    8/3: PHQ 9 total score: 17  And GAD 7 total score: 8 (reviewed and scanned)  4/2:PHQ-9 total score: 14 (score 10-14: moderate depression; significant for "more than half the days": depressed; insomnia; low energy; feel bad about self; poor concentration)    Diagnosis:   Axis I: Schizoaffective disorder; depressed type   Axis II: Deferred   Axis III: none   Axis IV: chronic mental illness   Axis V: 50-55    Prognosis: Fair for recovery.    Plan:   Seth Chase will receive medication management at the [facility].    Seth Chase's mood is overall stable on current regiment.  Reports some depressive symptoms and continues to have residual psychosis and paranoia but will distract self. No recent stressors.  Good social support from family. Has been dieting and exercising. Lost five pounds in three months.  Discussed he is on two antipsychotics (Seroquel and Fluphenazine) and suggested titrated off Fluphenazine or lowering the dose.  He is resistant

## 2017-06-08 NOTE — Progress Notes
traZODone (DESYREL) 50 MG PO Tablet TAKE 1 TABLET BY MOUTH EVERY NIGHT AT BEDTIME 04/20/17   Sharyne Richters, MD   Vitamin D 2000 UNITS Capsule Take 1 caplet by mouth daily.    Information, Historical     Review of systems:    Constitutional: denies weight gain; denies fatigue  HEENT: denies rhinorrhea, sore throat.  Respiratory: denies cough, shortness of breath  Cardiovascular: denies chest pain  GI: denies N/V/C/D, abdominal pain.  MSK: denies joint pain, muscle stiffness/soreness, back pain, neck pain.  Neuro: denies HA, dizziness.  Psychiatric: as above and below.    Vital Signs:    BP 114/85  - Pulse 101  - Temp 37.6 ?C (99.6 ?F)  - Resp 16    Weight: 220#    Last Wt, Ht, and BMI    Wt Readings from Last 3 Encounters:   04/20/17 100.6 kg (221 lb 12.8 oz)   03/29/17 102.1 kg (225 lb)   03/24/17 102.2 kg (225 lb 6.4 oz)     Ht Readings from Last 3 Encounters:   04/20/17 1.829 m (6')   03/29/17 1.829 m (6')   03/24/17 1.829 m (6')     No height and weight on file for this encounter.    Lab Results:      CBC (with or without Differential):   Lab Results   Component Value Date    WBC 6.7 04/09/2017    HGB 12.6 (L) 04/09/2017    HCT 38.6 04/09/2017    MCV 79.6 (L) 04/09/2017    MCH 26.0 (L) 04/09/2017    MCHC 32.6 04/09/2017    RDW 13.3 04/09/2017    PLATCOUNT 390 04/09/2017    MPV 9.1 04/09/2017    NEUTROPCT 66.5 04/09/2017    LYMPHPCT 27.1 04/09/2017    MONOPCT 5.5 04/09/2017    EOSPCT 0.6 04/09/2017    BASOPCT 0.3 04/09/2017    and BMP/CMP:   Lab Results   Component Value Date    NA 137 04/09/2017    K 5.0 04/09/2017    CL 101 04/09/2017    CO2 26 04/09/2017    BUN 13 04/09/2017    CREATININE 1.44 (H) 04/09/2017    GLU 119 (H) 04/09/2017    CALCIUM 9.9 04/09/2017    ALB 4.5 04/09/2017    AST 15 04/09/2017    ALT 15 04/09/2017    EGFR 55 (L) 04/09/2017    TBILI 0.5 04/09/2017    ALKPHOS 111 04/09/2017     Lab Results   Component Value Date    TRIG 145 04/09/2017    HDL 36 (L) 04/09/2017    LDL 157 (H) 04/09/2017

## 2017-06-08 NOTE — Progress Notes
Teaching/Attestation Statement  I saw and evaluated the patient with the resident. I was present in the room for the key portions of the exam including the HPI and mental status exam. I discussed the treatment recommendations with the patient and answered additional questions. I reviewed the resident's note and agree with the assessment and plan. See resident's note for details.

## 2017-06-08 NOTE — Progress Notes
[facility]    Patient Name: Seth Chase  Patient DOB: 02/24/1966  Patient MRN: [id number]    Provider: Zonia KiefKitty Leung, MD  Date of Service: 06/08/2017  Type of Service: follow up/med management  CPT Code: 5409899214 - Moderate complexity med. mgt. visit (some med changes, 30 min)   Insurance: Medicare [employer]    Chief Complaint: "medication management. "    Interval History:   Seth Chase is a 52 y.o. AA male who resides with brother [name] and nephew [name] in [place]. Has past psychiatric history significant for Schizoaffective disorder, depressed type.  Pt presents for follow up/med management. Last saw this provider on 04/20/2017 and prescribed Prolixin 10mg  (with plan to taper off), Seroquel 800 mg, Prozac 40mg , and Trazodone 50mg  prn.      Since last visit overall doing well with no acute complaints. Went to [place] to visit sister and family and had a great time. Brother rented RV.  Tried to stop the Trazodone but was up for three days so resumed back to Trazodone 50mg .  Mood is stable on current regiment. Rates depression 6/10.  Continues to have residual psychosis and paranoia but states he know to not listen to them and will distract and keep self busy.  Denies recent stressors/triggers.  Continues to exercise and walk [name] daily.  Trying to reduce sugar intake.  No issues with sleep and appetite.  Good emotional support from family.      No signs of mania, hypomania, not observed responding to internal stimuli.  Denies SI/HI.  Is adherent with psychotropic medications with no adverse effects.     Psychiatric History   Inpatient psychiatric hospitalizations: [facility]x [facility] [place]; 9 years ago  Previous outpatient psychiatric treatment: [facility] in [place]; [facility]: Bennie Hindhris Calandro, ARNP since 10/2013-04/2016  Prior therapy: none  Prior psychiatric medication trials:  Cannot recall the names  History of suicide attempts: hx of attempt 15-20 years ago    Family History:

## 2017-06-10 DIAGNOSIS — F251 Schizoaffective disorder, depressive type: Secondary | ICD-10-CM

## 2017-06-10 MED ORDER — TRAZODONE HCL 50 MG PO TABS
1 refills | Status: CP
Start: 2017-06-10 — End: 2017-08-03

## 2017-06-10 MED ORDER — FLUPHENAZINE HCL 10 MG PO TABS
10 mg | Freq: Every day | ORAL | 1 refills | Status: CP
Start: 2017-06-10 — End: 2017-08-03

## 2017-06-10 MED ORDER — QUETIAPINE FUMARATE 400 MG PO TABS
800 mg | Freq: Every evening | ORAL | 1 refills | Status: CP
Start: 2017-06-10 — End: 2017-08-03

## 2017-06-10 MED ORDER — FLUOXETINE HCL 40 MG PO CAPS
40 mg | Freq: Every day | ORAL | 1 refills | Status: CP
Start: 2017-06-10 — End: 2017-08-03

## 2017-08-03 ENCOUNTER — Ambulatory Visit: Attending: Psychiatry | Primary: Internal Medicine

## 2017-08-03 DIAGNOSIS — F251 Schizoaffective disorder, depressive type: Principal | ICD-10-CM

## 2017-08-03 MED ORDER — SHINGRIX 50 MCG/0.5ML IM SUSR
.5 mL | INTRAMUSCULAR | 0 refills
Start: 2017-08-03 — End: ?

## 2017-08-03 MED ORDER — QUETIAPINE FUMARATE 400 MG PO TABS
800 mg | Freq: Every evening | ORAL | 2 refills | Status: CP
Start: 2017-08-03 — End: 2017-09-07

## 2017-08-03 MED ORDER — TRAZODONE HCL 50 MG PO TABS
2 refills | Status: CP
Start: 2017-08-03 — End: 2017-09-07

## 2017-08-03 MED ORDER — FLUOXETINE HCL 40 MG PO CAPS
40 mg | Freq: Every day | ORAL | 2 refills | Status: CP
Start: 2017-08-03 — End: 2017-09-07

## 2017-08-03 MED ORDER — FLUPHENAZINE HCL 10 MG PO TABS
10 mg | Freq: Every day | ORAL | 1 refills | Status: CP
Start: 2017-08-03 — End: 2017-09-07

## 2017-08-03 NOTE — Progress Notes
Teaching/Attestation Statement  I saw and evaluated the patient.  I reviewed the Resident note and agree with the assessment and plan. See Resident's note for details. I performed the key elements of the history, exam and treatment planning.

## 2017-09-07 ENCOUNTER — Ambulatory Visit: Attending: Psychiatry | Primary: Internal Medicine

## 2017-09-07 DIAGNOSIS — F251 Schizoaffective disorder, depressive type: Principal | ICD-10-CM

## 2017-09-07 MED ORDER — FLUOXETINE HCL 40 MG PO CAPS
40 mg | Freq: Every day | ORAL | 2 refills | Status: CP
Start: 2017-09-07 — End: 2017-10-12

## 2017-09-07 MED ORDER — TRAZODONE HCL 50 MG PO TABS
2 refills | Status: CP
Start: 2017-09-07 — End: 2017-10-12

## 2017-09-07 MED ORDER — FLUPHENAZINE HCL 10 MG PO TABS
10 mg | Freq: Two times a day (BID) | ORAL | 1 refills | Status: CP
Start: 2017-09-07 — End: 2017-10-12

## 2017-09-07 MED ORDER — QUETIAPINE FUMARATE 400 MG PO TABS
800 mg | Freq: Every evening | ORAL | 2 refills | Status: CP
Start: 2017-09-07 — End: 2017-10-12

## 2017-09-20 ENCOUNTER — Ambulatory Visit: Admit: 2017-09-20 | Discharge: 2017-09-20 | Attending: Internal Medicine | Primary: Internal Medicine

## 2017-09-20 DIAGNOSIS — Z87891 Personal history of nicotine dependence: Secondary | ICD-10-CM

## 2017-09-20 DIAGNOSIS — E669 Obesity, unspecified: Secondary | ICD-10-CM

## 2017-09-20 DIAGNOSIS — Z6830 Body mass index (BMI) 30.0-30.9, adult: Secondary | ICD-10-CM

## 2017-09-20 DIAGNOSIS — N179 Acute kidney failure, unspecified: Secondary | ICD-10-CM

## 2017-09-20 DIAGNOSIS — E559 Vitamin D deficiency, unspecified: Secondary | ICD-10-CM

## 2017-09-20 DIAGNOSIS — F259 Schizoaffective disorder, unspecified: Principal | ICD-10-CM

## 2017-09-20 DIAGNOSIS — E785 Hyperlipidemia, unspecified: Secondary | ICD-10-CM

## 2017-09-20 DIAGNOSIS — F251 Schizoaffective disorder, depressive type: Secondary | ICD-10-CM

## 2017-09-20 DIAGNOSIS — I10 Essential (primary) hypertension: Principal | ICD-10-CM

## 2017-09-20 DIAGNOSIS — D509 Iron deficiency anemia, unspecified: Secondary | ICD-10-CM

## 2017-09-20 DIAGNOSIS — E6609 Other obesity due to excess calories: Secondary | ICD-10-CM

## 2017-09-20 DIAGNOSIS — Z1211 Encounter for screening for malignant neoplasm of colon: Secondary | ICD-10-CM

## 2017-10-12 ENCOUNTER — Ambulatory Visit: Attending: Psychiatry | Primary: Internal Medicine

## 2017-10-12 DIAGNOSIS — F251 Schizoaffective disorder, depressive type: Principal | ICD-10-CM

## 2017-10-12 DIAGNOSIS — F259 Schizoaffective disorder, unspecified: Secondary | ICD-10-CM

## 2017-10-12 MED ORDER — FLUOXETINE HCL 40 MG PO CAPS
40 mg | Freq: Every day | ORAL | 2 refills | Status: CP
Start: 2017-10-12 — End: 2018-01-13

## 2017-10-12 MED ORDER — TRAZODONE HCL 50 MG PO TABS
2 refills | Status: CP
Start: 2017-10-12 — End: 2018-01-13

## 2017-10-12 MED ORDER — FLUPHENAZINE HCL 10 MG PO TABS
10 mg | Freq: Two times a day (BID) | ORAL | 2 refills | Status: CP
Start: 2017-10-12 — End: 2018-01-13

## 2017-10-12 MED ORDER — QUETIAPINE FUMARATE 400 MG PO TABS
800 mg | Freq: Every evening | ORAL | 2 refills | Status: CP
Start: 2017-10-12 — End: 2018-01-13

## 2018-01-13 ENCOUNTER — Ambulatory Visit: Attending: Psychiatry | Primary: Internal Medicine

## 2018-01-13 DIAGNOSIS — F251 Schizoaffective disorder, depressive type: Principal | ICD-10-CM

## 2018-01-13 DIAGNOSIS — G4700 Insomnia, unspecified: Secondary | ICD-10-CM

## 2018-01-13 DIAGNOSIS — F259 Schizoaffective disorder, unspecified: Principal | ICD-10-CM

## 2018-01-13 MED ORDER — TRAZODONE HCL 50 MG PO TABS
2 refills | Status: CP
Start: 2018-01-13 — End: 2018-04-07

## 2018-01-13 MED ORDER — QUETIAPINE FUMARATE 400 MG PO TABS
800 mg | Freq: Every evening | ORAL | 2 refills | Status: CP
Start: 2018-01-13 — End: 2018-04-07

## 2018-01-13 MED ORDER — FLUOXETINE HCL 40 MG PO CAPS
40 mg | Freq: Every day | ORAL | 2 refills | Status: CP
Start: 2018-01-13 — End: 2018-04-07

## 2018-01-13 MED ORDER — FLUPHENAZINE HCL 10 MG PO TABS
10 mg | Freq: Two times a day (BID) | ORAL | 2 refills | Status: CP
Start: 2018-01-13 — End: 2018-04-15

## 2018-02-28 ENCOUNTER — Inpatient Hospital Stay: Attending: Internal Medicine | Primary: Internal Medicine

## 2018-03-28 ENCOUNTER — Inpatient Hospital Stay: Attending: Internal Medicine | Primary: Internal Medicine

## 2018-04-05 DIAGNOSIS — F251 Schizoaffective disorder, depressive type: Principal | ICD-10-CM

## 2018-04-06 MED ORDER — FLUOXETINE HCL 40 MG PO CAPS
40 mg | Freq: Every day | ORAL | 2 refills | Status: CP
Start: 2018-04-06 — End: ?

## 2018-04-06 MED ORDER — TRAZODONE HCL 50 MG PO TABS
2 refills | Status: CP
Start: 2018-04-06 — End: ?

## 2018-04-06 MED ORDER — QUETIAPINE FUMARATE 400 MG PO TABS
800 mg | Freq: Every evening | ORAL | 2 refills | Status: CP
Start: 2018-04-06 — End: ?

## 2018-04-14 ENCOUNTER — Encounter: Attending: Psychiatry | Primary: Internal Medicine

## 2018-04-15 ENCOUNTER — Ambulatory Visit: Attending: Psychiatry | Primary: Internal Medicine

## 2018-04-15 DIAGNOSIS — G4700 Insomnia, unspecified: Secondary | ICD-10-CM

## 2018-04-15 DIAGNOSIS — F259 Schizoaffective disorder, unspecified: Principal | ICD-10-CM

## 2018-04-15 DIAGNOSIS — F251 Schizoaffective disorder, depressive type: Principal | ICD-10-CM

## 2018-04-15 MED ORDER — FLUPHENAZINE HCL 10 MG PO TABS
10 mg | Freq: Two times a day (BID) | ORAL | 2 refills | Status: CP
Start: 2018-04-15 — End: ?

## 2018-04-18 DIAGNOSIS — E139 Other specified diabetes mellitus without complications: Principal | ICD-10-CM

## 2018-06-14 DIAGNOSIS — E139 Other specified diabetes mellitus without complications: Principal | ICD-10-CM

## 2018-06-20 ENCOUNTER — Encounter: Attending: Psychiatric/Mental Health | Primary: Internal Medicine

## 2018-06-28 ENCOUNTER — Ambulatory Visit: Attending: Psychiatric/Mental Health | Primary: Internal Medicine

## 2018-06-28 DIAGNOSIS — F251 Schizoaffective disorder, depressive type: Principal | ICD-10-CM

## 2018-06-28 MED ORDER — TRAZODONE HCL 50 MG PO TABS
2 refills | Status: CP
Start: 2018-06-28 — End: 2018-09-27

## 2018-06-28 MED ORDER — QUETIAPINE FUMARATE 400 MG PO TABS
800 mg | Freq: Every evening | ORAL | 2 refills | Status: CP
Start: 2018-06-28 — End: 2018-09-27

## 2018-06-28 MED ORDER — FLUPHENAZINE HCL 10 MG PO TABS
10 mg | Freq: Two times a day (BID) | ORAL | 2 refills | Status: CP
Start: 2018-06-28 — End: 2018-09-27

## 2018-06-28 MED ORDER — FLUOXETINE HCL 40 MG PO CAPS
40 mg | Freq: Every day | ORAL | 2 refills | Status: CP
Start: 2018-06-28 — End: 2018-09-27

## 2018-06-29 DIAGNOSIS — F259 Schizoaffective disorder, unspecified: Principal | ICD-10-CM

## 2018-09-26 ENCOUNTER — Encounter: Attending: Psychiatric/Mental Health | Primary: Internal Medicine

## 2018-09-27 ENCOUNTER — Ambulatory Visit: Attending: Psychiatric/Mental Health | Primary: Internal Medicine

## 2018-09-27 DIAGNOSIS — F251 Schizoaffective disorder, depressive type: Principal | ICD-10-CM

## 2018-09-27 MED ORDER — FLUPHENAZINE HCL 10 MG PO TABS
10 mg | Freq: Two times a day (BID) | ORAL | 2 refills | Status: CP
Start: 2018-09-27 — End: ?

## 2018-09-27 MED ORDER — TRAZODONE HCL 50 MG PO TABS
2 refills | Status: CP
Start: 2018-09-27 — End: ?

## 2018-09-27 MED ORDER — FLUOXETINE HCL 40 MG PO CAPS
40 mg | Freq: Every day | ORAL | 2 refills | Status: CP
Start: 2018-09-27 — End: ?

## 2018-09-27 MED ORDER — QUETIAPINE FUMARATE 400 MG PO TABS
800 mg | Freq: Every evening | ORAL | 2 refills | Status: CP
Start: 2018-09-27 — End: ?

## 2018-09-30 DIAGNOSIS — F259 Schizoaffective disorder, unspecified: Principal | ICD-10-CM

## 2018-12-29 ENCOUNTER — Encounter: Attending: Psychiatric/Mental Health | Primary: Internal Medicine

## 2019-01-10 ENCOUNTER — Encounter: Attending: Psychiatric/Mental Health | Primary: Internal Medicine

## 2019-01-11 DIAGNOSIS — F251 Schizoaffective disorder, depressive type: Principal | ICD-10-CM

## 2019-01-11 MED ORDER — QUETIAPINE FUMARATE 400 MG PO TABS
2 refills
Start: 2019-01-11 — End: ?

## 2019-01-26 ENCOUNTER — Telehealth: Attending: Psychiatric/Mental Health | Primary: Internal Medicine

## 2019-01-26 DIAGNOSIS — F251 Schizoaffective disorder, depressive type: Principal | ICD-10-CM

## 2019-01-26 MED ORDER — QUETIAPINE FUMARATE 400 MG PO TABS
800 mg | Freq: Every evening | ORAL | 2 refills | Status: CP
Start: 2019-01-26 — End: ?

## 2019-01-26 MED ORDER — TRAZODONE HCL 50 MG PO TABS
2 refills | Status: CP
Start: 2019-01-26 — End: ?

## 2019-01-26 MED ORDER — FLUPHENAZINE HCL 10 MG PO TABS
10 mg | Freq: Two times a day (BID) | ORAL | 2 refills | Status: CP
Start: 2019-01-26 — End: ?

## 2019-01-27 DIAGNOSIS — F259 Schizoaffective disorder, unspecified: Principal | ICD-10-CM

## 2019-02-23 ENCOUNTER — Telehealth: Attending: Psychiatric/Mental Health | Primary: Internal Medicine

## 2019-02-23 DIAGNOSIS — F251 Schizoaffective disorder, depressive type: Principal | ICD-10-CM

## 2019-02-23 MED ORDER — TRAZODONE HCL 50 MG PO TABS
2 refills | Status: CP
Start: 2019-02-23 — End: ?

## 2019-02-23 MED ORDER — QUETIAPINE FUMARATE 400 MG PO TABS
800 mg | Freq: Every evening | ORAL | 2 refills | Status: CP
Start: 2019-02-23 — End: ?

## 2019-02-23 MED ORDER — FLUPHENAZINE HCL 10 MG PO TABS
10 mg | Freq: Three times a day (TID) | ORAL | 1 refills | Status: CP
Start: 2019-02-23 — End: ?

## 2019-02-23 MED ORDER — ESCITALOPRAM OXALATE 10 MG PO TABS
1 refills | Status: CP
Start: 2019-02-23 — End: ?

## 2019-02-27 DIAGNOSIS — F259 Schizoaffective disorder, unspecified: Principal | ICD-10-CM

## 2019-03-23 ENCOUNTER — Telehealth: Attending: Psychiatric/Mental Health | Primary: Internal Medicine

## 2019-03-23 DIAGNOSIS — F251 Schizoaffective disorder, depressive type: Principal | ICD-10-CM

## 2019-03-23 MED ORDER — FLUOXETINE HCL 40 MG PO CAPS
40 mg | Freq: Every day | ORAL | 2 refills
Start: 2019-03-23 — End: ?

## 2019-03-23 MED ORDER — ESCITALOPRAM OXALATE 10 MG PO TABS
10 mg | Freq: Every day | ORAL | 2 refills | Status: CP
Start: 2019-03-23 — End: ?

## 2019-03-23 MED ORDER — FLUPHENAZINE HCL 10 MG PO TABS
10 mg | Freq: Three times a day (TID) | ORAL | 2 refills | Status: CP
Start: 2019-03-23 — End: ?

## 2019-03-27 DIAGNOSIS — F259 Schizoaffective disorder, unspecified: Principal | ICD-10-CM

## 2019-05-15 DIAGNOSIS — F251 Schizoaffective disorder, depressive type: Principal | ICD-10-CM

## 2019-05-15 MED ORDER — ESCITALOPRAM OXALATE 10 MG PO TABS
2 refills
Start: 2019-05-15 — End: ?

## 2019-05-23 ENCOUNTER — Telehealth: Payer: Medicare Other | Attending: Psychiatric/Mental Health | Primary: Internal Medicine

## 2019-05-23 DIAGNOSIS — F259 Schizoaffective disorder, unspecified: Principal | ICD-10-CM

## 2019-05-23 DIAGNOSIS — F251 Schizoaffective disorder, depressive type: Principal | ICD-10-CM

## 2019-05-23 MED ORDER — QUETIAPINE FUMARATE 400 MG PO TABS
800 mg | Freq: Every evening | ORAL | 2 refills | Status: CP
Start: 2019-05-23 — End: ?

## 2019-05-23 MED ORDER — TRAZODONE HCL 50 MG PO TABS
2 refills | Status: CP
Start: 2019-05-23 — End: ?

## 2019-05-23 MED ORDER — ESCITALOPRAM OXALATE 10 MG PO TABS
10 mg | Freq: Every day | ORAL | 2 refills | Status: CP
Start: 2019-05-23 — End: ?

## 2019-05-23 MED ORDER — FLUPHENAZINE HCL 10 MG PO TABS
10 mg | Freq: Three times a day (TID) | ORAL | 2 refills | Status: CP
Start: 2019-05-23 — End: ?

## 2019-06-04 DIAGNOSIS — F251 Schizoaffective disorder, depressive type: Principal | ICD-10-CM

## 2019-06-07 DIAGNOSIS — Z79899 Other long term (current) drug therapy: Secondary | ICD-10-CM

## 2019-06-07 DIAGNOSIS — F251 Schizoaffective disorder, depressive type: Principal | ICD-10-CM

## 2019-06-07 MED ORDER — TRAZODONE HCL 50 MG PO TABS
2 refills
Start: 2019-06-07 — End: ?

## 2019-06-12 ENCOUNTER — Ambulatory Visit
Admit: 2019-06-12 | Discharge: 2019-06-13 | Payer: Medicare Other | Attending: Internal Medicine | Primary: Internal Medicine

## 2019-06-12 DIAGNOSIS — J45909 Unspecified asthma, uncomplicated: Principal | ICD-10-CM

## 2019-06-12 DIAGNOSIS — Z87891 Personal history of nicotine dependence: Secondary | ICD-10-CM

## 2019-06-12 MED ORDER — FLUTICASONE PROPIONATE 50 MCG/ACT NA SUSP
1 | Freq: Every day | NASAL | 0 refills | Status: CP
Start: 2019-06-12 — End: ?

## 2019-06-12 MED ORDER — FEXOFENADINE HCL 60 MG PO TABS
60 mg | Freq: Two times a day (BID) | ORAL | 0 refills | Status: CP
Start: 2019-06-12 — End: ?

## 2019-07-17 DIAGNOSIS — J45909 Unspecified asthma, uncomplicated: Principal | ICD-10-CM

## 2019-07-17 MED ORDER — FEXOFENADINE HCL 60 MG PO TABS
0 refills | Status: CP
Start: 2019-07-17 — End: ?

## 2019-08-09 DIAGNOSIS — J45909 Unspecified asthma, uncomplicated: Principal | ICD-10-CM

## 2019-08-10 MED ORDER — FLUTICASONE PROPIONATE 50 MCG/ACT NA SUSP
0 refills | Status: CP
Start: 2019-08-10 — End: ?

## 2019-08-15 DIAGNOSIS — F251 Schizoaffective disorder, depressive type: Principal | ICD-10-CM

## 2019-08-16 MED ORDER — QUETIAPINE FUMARATE 400 MG PO TABS
2 refills
Start: 2019-08-16 — End: ?

## 2019-08-16 MED ORDER — TRAZODONE HCL 50 MG PO TABS
2 refills
Start: 2019-08-16 — End: ?

## 2019-08-16 MED ORDER — ESCITALOPRAM OXALATE 10 MG PO TABS
10 mg | Freq: Every day | ORAL | 2 refills
Start: 2019-08-16 — End: ?

## 2019-08-21 ENCOUNTER — Telehealth: Payer: Medicare Other | Attending: Psychiatric/Mental Health | Primary: Internal Medicine

## 2019-08-21 DIAGNOSIS — F259 Schizoaffective disorder, unspecified: Principal | ICD-10-CM

## 2019-08-21 DIAGNOSIS — F251 Schizoaffective disorder, depressive type: Principal | ICD-10-CM

## 2019-08-21 MED ORDER — ESCITALOPRAM OXALATE 10 MG PO TABS
10 mg | Freq: Every day | ORAL | 2 refills | Status: CP
Start: 2019-08-21 — End: ?

## 2019-08-21 MED ORDER — QUETIAPINE FUMARATE 400 MG PO TABS
800 mg | Freq: Every evening | ORAL | 2 refills | Status: CP
Start: 2019-08-21 — End: ?

## 2019-08-21 MED ORDER — FLUPHENAZINE HCL 10 MG PO TABS
20 mg | Freq: Two times a day (BID) | ORAL | 2 refills | Status: CP
Start: 2019-08-21 — End: ?

## 2019-08-21 MED ORDER — TRAZODONE HCL 50 MG PO TABS
2 refills | Status: CP
Start: 2019-08-21 — End: ?

## 2019-08-23 DIAGNOSIS — B353 Tinea pedis: Principal | ICD-10-CM

## 2019-08-23 MED ORDER — CLOTRIMAZOLE 1 % EX CREA
TOPICAL | 0 refills | 15.00000 days | Status: CP
Start: 2019-08-23 — End: ?

## 2019-09-14 DIAGNOSIS — J45909 Unspecified asthma, uncomplicated: Principal | ICD-10-CM

## 2019-09-14 MED ORDER — FEXOFENADINE HCL 60 MG PO TABS
0 refills | Status: CP
Start: 2019-09-14 — End: ?

## 2019-09-21 DIAGNOSIS — F251 Schizoaffective disorder, depressive type: Principal | ICD-10-CM

## 2019-09-21 DIAGNOSIS — F259 Schizoaffective disorder, unspecified: Principal | ICD-10-CM

## 2019-09-21 MED ORDER — TRAZODONE HCL 50 MG PO TABS
2 refills | Status: CP
Start: 2019-09-21 — End: ?

## 2019-09-21 MED ORDER — ESCITALOPRAM OXALATE 10 MG PO TABS
10 mg | Freq: Every day | ORAL | 2 refills | Status: CP
Start: 2019-09-21 — End: ?

## 2019-09-21 MED ORDER — FLUPHENAZINE HCL 10 MG PO TABS
20 mg | Freq: Two times a day (BID) | ORAL | 2 refills | Status: CP
Start: 2019-09-21 — End: ?

## 2019-09-21 MED ORDER — QUETIAPINE FUMARATE 400 MG PO TABS
800 mg | Freq: Every evening | ORAL | 2 refills | 30.00000 days | Status: CP
Start: 2019-09-21 — End: ?

## 2019-10-13 DIAGNOSIS — J45909 Unspecified asthma, uncomplicated: Principal | ICD-10-CM

## 2019-10-17 MED ORDER — FLUTICASONE PROPIONATE 50 MCG/ACT NA SUSP
0 refills | Status: CP
Start: 2019-10-17 — End: ?

## 2019-10-24 DIAGNOSIS — J45909 Unspecified asthma, uncomplicated: Principal | ICD-10-CM

## 2019-10-27 MED ORDER — FEXOFENADINE HCL 60 MG PO TABS
0 refills | Status: CP
Start: 2019-10-27 — End: ?

## 2019-12-05 DIAGNOSIS — F251 Schizoaffective disorder, depressive type: Principal | ICD-10-CM

## 2019-12-05 MED ORDER — QUETIAPINE FUMARATE 400 MG PO TABS
2 refills
Start: 2019-12-05 — End: ?

## 2019-12-05 MED ORDER — TRAZODONE HCL 50 MG PO TABS
2 refills
Start: 2019-12-05 — End: ?

## 2019-12-21 DIAGNOSIS — F259 Schizoaffective disorder, unspecified: Principal | ICD-10-CM

## 2019-12-21 DIAGNOSIS — F251 Schizoaffective disorder, depressive type: Principal | ICD-10-CM

## 2019-12-21 MED ORDER — TRAZODONE HCL 50 MG PO TABS
2 refills | Status: CP
Start: 2019-12-21 — End: ?

## 2019-12-21 MED ORDER — FLUPHENAZINE HCL 10 MG PO TABS
20 mg | Freq: Two times a day (BID) | ORAL | 2 refills | Status: CP
Start: 2019-12-21 — End: ?

## 2019-12-21 MED ORDER — QUETIAPINE FUMARATE 400 MG PO TABS
800 mg | Freq: Every evening | ORAL | 2 refills | Status: CP
Start: 2019-12-21 — End: ?

## 2019-12-21 MED ORDER — ESCITALOPRAM OXALATE 10 MG PO TABS
10 mg | Freq: Every day | ORAL | 2 refills | Status: CP
Start: 2019-12-21 — End: ?

## 2020-03-25 ENCOUNTER — Ambulatory Visit: Payer: Medicare Other | Attending: Psychiatry | Primary: Student in an Organized Health Care Education/Training Program

## 2020-03-25 DIAGNOSIS — G4709 Other insomnia: Secondary | ICD-10-CM

## 2020-03-25 DIAGNOSIS — F251 Schizoaffective disorder, depressive type: Principal | ICD-10-CM

## 2020-03-25 DIAGNOSIS — F259 Schizoaffective disorder, unspecified: Principal | ICD-10-CM

## 2020-03-25 DIAGNOSIS — F411 Generalized anxiety disorder: Secondary | ICD-10-CM

## 2020-03-25 DIAGNOSIS — Z5181 Encounter for therapeutic drug level monitoring: Secondary | ICD-10-CM

## 2020-03-25 MED ORDER — QUETIAPINE FUMARATE 400 MG PO TABS
800 mg | Freq: Every evening | ORAL | 2 refills | Status: CP
Start: 2020-03-25 — End: ?

## 2020-03-25 MED ORDER — TRAZODONE HCL 50 MG PO TABS
2 refills | Status: CP
Start: 2020-03-25 — End: ?

## 2020-03-25 MED ORDER — FLUPHENAZINE HCL 10 MG PO TABS
20 mg | Freq: Two times a day (BID) | ORAL | 2 refills | Status: CP
Start: 2020-03-25 — End: ?

## 2020-03-25 MED ORDER — ESCITALOPRAM OXALATE 10 MG PO TABS
10 mg | Freq: Every day | ORAL | 2 refills | Status: CP
Start: 2020-03-25 — End: ?

## 2020-03-29 ENCOUNTER — Ambulatory Visit
Admit: 2020-03-29 | Discharge: 2020-03-30 | Payer: Medicare Other | Attending: Internal Medicine | Primary: Student in an Organized Health Care Education/Training Program

## 2020-03-29 DIAGNOSIS — Z0000 Encounter for general adult medical examination without abnormal findings: Secondary | ICD-10-CM

## 2020-03-29 DIAGNOSIS — J45909 Unspecified asthma, uncomplicated: Principal | ICD-10-CM

## 2020-03-29 DIAGNOSIS — Z23 Encounter for immunization: Secondary | ICD-10-CM

## 2020-03-29 DIAGNOSIS — E559 Vitamin D deficiency, unspecified: Secondary | ICD-10-CM

## 2020-03-29 DIAGNOSIS — Z125 Encounter for screening for malignant neoplasm of prostate: Secondary | ICD-10-CM

## 2020-03-29 MED ORDER — FLUTICASONE PROPIONATE 50 MCG/ACT NA SUSP
1 | Freq: Every day | NASAL | 0 refills | Status: CP
Start: 2020-03-29 — End: ?

## 2020-03-29 MED ORDER — ERGOCALCIFEROL 1.25 MG (50000 UT) PO CAPS
1.25 mg | ORAL | 2 refills | 28.00000 days | Status: CP
Start: 2020-03-29 — End: ?

## 2020-03-29 MED ORDER — FEXOFENADINE HCL 60 MG PO TABS
60 mg | Freq: Two times a day (BID) | ORAL | 0 refills | Status: CP
Start: 2020-03-29 — End: ?

## 2020-05-25 DIAGNOSIS — J45909 Unspecified asthma, uncomplicated: Principal | ICD-10-CM

## 2020-05-26 MED ORDER — FLUTICASONE PROPIONATE 50 MCG/ACT NA SUSP
0 refills | 30.00 days | Status: CP
Start: 2020-05-26 — End: ?

## 2020-06-13 DIAGNOSIS — Z0000 Encounter for general adult medical examination without abnormal findings: Principal | ICD-10-CM

## 2020-06-13 MED ORDER — VITAMIN D (ERGOCALCIFEROL) 1.25 MG (50000 UT) PO CAPS
ORAL | 2 refills | 28.00000 days | Status: CP
Start: 2020-06-13 — End: ?

## 2020-06-17 ENCOUNTER — Ambulatory Visit: Payer: Medicare Other | Attending: Psychiatry | Primary: Student in an Organized Health Care Education/Training Program

## 2020-06-17 ENCOUNTER — Ambulatory Visit
Admit: 2020-06-17 | Discharge: 2020-06-18 | Payer: Medicare Other | Attending: Student in an Organized Health Care Education/Training Program | Primary: Student in an Organized Health Care Education/Training Program

## 2020-06-17 DIAGNOSIS — J45909 Unspecified asthma, uncomplicated: Principal | ICD-10-CM

## 2020-06-17 DIAGNOSIS — F251 Schizoaffective disorder, depressive type: Principal | ICD-10-CM

## 2020-06-17 DIAGNOSIS — M25512 Pain in left shoulder: Secondary | ICD-10-CM

## 2020-06-17 DIAGNOSIS — Z87891 Personal history of nicotine dependence: Secondary | ICD-10-CM

## 2020-06-17 DIAGNOSIS — F259 Schizoaffective disorder, unspecified: Principal | ICD-10-CM

## 2020-06-17 DIAGNOSIS — G8929 Other chronic pain: Secondary | ICD-10-CM

## 2020-06-17 DIAGNOSIS — M25511 Pain in right shoulder: Secondary | ICD-10-CM

## 2020-06-17 DIAGNOSIS — F5104 Psychophysiologic insomnia: Secondary | ICD-10-CM

## 2020-06-17 DIAGNOSIS — K5903 Drug induced constipation: Secondary | ICD-10-CM

## 2020-06-17 MED ORDER — IBUPROFEN 600 MG PO TABS
ORAL | 0 refills | 25.00000 days | Status: CP
Start: 2020-06-17 — End: ?

## 2020-06-17 MED ORDER — FEXOFENADINE HCL 60 MG PO TABS
60 mg | Freq: Two times a day (BID) | ORAL | 1 refills | Status: CP
Start: 2020-06-17 — End: ?

## 2020-06-17 MED ORDER — QUETIAPINE FUMARATE 400 MG PO TABS
2 refills | Status: CP
Start: 2020-06-17 — End: ?

## 2020-06-17 MED ORDER — DOCUSATE SODIUM 100 MG PO CAPS
100 mg | Freq: Every day | ORAL | 2 refills | 30.00000 days | Status: CP | PRN
Start: 2020-06-17 — End: ?

## 2020-06-17 MED ORDER — TRAZODONE HCL 50 MG PO TABS
ORAL | 2 refills | 30.00000 days | Status: CP
Start: 2020-06-17 — End: ?

## 2020-06-17 MED ORDER — FLUPHENAZINE HCL 10 MG PO TABS
20 mg | Freq: Two times a day (BID) | ORAL | 2 refills | Status: CP
Start: 2020-06-17 — End: ?

## 2020-06-17 MED ORDER — IBUPROFEN 600 MG PO TABS
ORAL | 0 refills | 25.00000 days | Status: CP
Start: 2020-06-17 — End: 2020-06-18

## 2020-06-17 MED ORDER — ESCITALOPRAM OXALATE 10 MG PO TABS
10 mg | Freq: Every day | ORAL | 2 refills | Status: CP
Start: 2020-06-17 — End: ?

## 2020-06-24 ENCOUNTER — Encounter: Payer: Medicare Other | Attending: Psychiatry | Primary: Student in an Organized Health Care Education/Training Program

## 2020-07-26 DIAGNOSIS — J45909 Unspecified asthma, uncomplicated: Principal | ICD-10-CM

## 2020-07-27 MED ORDER — FLUTICASONE PROPIONATE 50 MCG/ACT NA SUSP
NASAL | 0 refills | 30.00000 days | Status: CP
Start: 2020-07-27 — End: ?

## 2020-08-01 MED ORDER — IBUPROFEN 600 MG PO TABS
0 refills
Start: 2020-08-01 — End: ?

## 2020-09-16 ENCOUNTER — Ambulatory Visit: Payer: Medicare Other | Attending: Psychiatry | Primary: Student in an Organized Health Care Education/Training Program

## 2020-09-16 DIAGNOSIS — F5104 Psychophysiologic insomnia: Secondary | ICD-10-CM

## 2020-09-16 DIAGNOSIS — F251 Schizoaffective disorder, depressive type: Principal | ICD-10-CM

## 2020-09-16 DIAGNOSIS — F259 Schizoaffective disorder, unspecified: Principal | ICD-10-CM

## 2020-09-16 DIAGNOSIS — K5903 Drug induced constipation: Secondary | ICD-10-CM

## 2020-09-16 MED ORDER — ESCITALOPRAM OXALATE 10 MG PO TABS
10 mg | Freq: Every evening | ORAL | 2 refills | Status: CP
Start: 2020-09-16 — End: ?

## 2020-09-16 MED ORDER — QUETIAPINE FUMARATE ER 300 MG PO TB24
600 mg | Freq: Every evening | ORAL | 2 refills | Status: CP
Start: 2020-09-16 — End: ?

## 2020-09-16 MED ORDER — TRAZODONE HCL 50 MG PO TABS
2 refills | Status: CP
Start: 2020-09-16 — End: ?

## 2020-09-16 MED ORDER — FLUPHENAZINE HCL 10 MG PO TABS
20 mg | Freq: Two times a day (BID) | ORAL | 2 refills | Status: CP
Start: 2020-09-16 — End: ?

## 2020-09-16 MED ORDER — DOCUSATE SODIUM 100 MG PO CAPS
100 mg | Freq: Every day | ORAL | 2 refills | 60.00 days | Status: CP | PRN
Start: 2020-09-16 — End: ?

## 2020-12-10 DIAGNOSIS — F251 Schizoaffective disorder, depressive type: Principal | ICD-10-CM

## 2020-12-10 MED ORDER — FLUPHENAZINE HCL 10 MG PO TABS
20 mg | Freq: Two times a day (BID) | ORAL | 0 refills | Status: CP
Start: 2020-12-10 — End: ?

## 2020-12-13 DIAGNOSIS — F251 Schizoaffective disorder, depressive type: Principal | ICD-10-CM

## 2020-12-13 MED ORDER — ESCITALOPRAM OXALATE 10 MG PO TABS
10 mg | Freq: Every evening | ORAL | 2 refills | Status: CP
Start: 2020-12-13 — End: ?

## 2020-12-16 ENCOUNTER — Inpatient Hospital Stay
Attending: Student in an Organized Health Care Education/Training Program | Primary: Student in an Organized Health Care Education/Training Program

## 2020-12-18 ENCOUNTER — Ambulatory Visit: Payer: Medicare Other | Attending: Psychiatry | Primary: Student in an Organized Health Care Education/Training Program

## 2020-12-18 DIAGNOSIS — F259 Schizoaffective disorder, unspecified: Principal | ICD-10-CM

## 2020-12-18 DIAGNOSIS — G4700 Insomnia, unspecified: Secondary | ICD-10-CM

## 2020-12-18 DIAGNOSIS — F251 Schizoaffective disorder, depressive type: Principal | ICD-10-CM

## 2020-12-18 DIAGNOSIS — K5903 Drug induced constipation: Secondary | ICD-10-CM

## 2020-12-18 MED ORDER — DOCUSATE SODIUM 100 MG PO CAPS
100 mg | Freq: Every day | ORAL | 2 refills | Status: CP | PRN
Start: 2020-12-18 — End: ?

## 2020-12-18 MED ORDER — QUETIAPINE FUMARATE ER 300 MG PO TB24
600 mg | Freq: Every evening | ORAL | 2 refills | Status: CP
Start: 2020-12-18 — End: ?

## 2020-12-18 MED ORDER — TRAZODONE HCL 50 MG PO TABS
2 refills | Status: CP
Start: 2020-12-18 — End: ?

## 2020-12-30 ENCOUNTER — Ambulatory Visit
Admit: 2020-12-30 | Discharge: 2020-12-31 | Payer: Medicare Other | Attending: Student in an Organized Health Care Education/Training Program | Primary: Student in an Organized Health Care Education/Training Program

## 2020-12-30 DIAGNOSIS — J45909 Unspecified asthma, uncomplicated: Secondary | ICD-10-CM

## 2020-12-30 DIAGNOSIS — Z0000 Encounter for general adult medical examination without abnormal findings: Principal | ICD-10-CM

## 2020-12-30 DIAGNOSIS — R5383 Other fatigue: Secondary | ICD-10-CM

## 2020-12-30 DIAGNOSIS — F259 Schizoaffective disorder, unspecified: Principal | ICD-10-CM

## 2020-12-30 DIAGNOSIS — K5909 Other constipation: Secondary | ICD-10-CM

## 2020-12-30 MED ORDER — FEXOFENADINE HCL 60 MG PO TABS
60 mg | Freq: Two times a day (BID) | ORAL | 1 refills | Status: CP
Start: 2020-12-30 — End: ?

## 2020-12-30 MED ORDER — SENNOSIDES-DOCUSATE SODIUM 8.6-50 MG PO TABS
2 | ORAL_TABLET | Freq: Every day | ORAL | 0 refills | 30.00000 days | Status: CP | PRN
Start: 2020-12-30 — End: ?

## 2021-01-01 DIAGNOSIS — F259 Schizoaffective disorder, unspecified: Principal | ICD-10-CM

## 2021-01-08 DIAGNOSIS — F251 Schizoaffective disorder, depressive type: Principal | ICD-10-CM

## 2021-01-08 MED ORDER — FLUPHENAZINE HCL 10 MG PO TABS
20 mg | Freq: Two times a day (BID) | ORAL | 0 refills
Start: 2021-01-08 — End: ?

## 2021-03-12 ENCOUNTER — Ambulatory Visit: Payer: Medicare Other | Attending: Psychiatry | Primary: Student in an Organized Health Care Education/Training Program

## 2021-03-12 DIAGNOSIS — F259 Schizoaffective disorder, unspecified: Principal | ICD-10-CM

## 2021-03-12 DIAGNOSIS — G4700 Insomnia, unspecified: Secondary | ICD-10-CM

## 2021-03-12 DIAGNOSIS — F251 Schizoaffective disorder, depressive type: Principal | ICD-10-CM

## 2021-03-12 DIAGNOSIS — K5903 Drug induced constipation: Secondary | ICD-10-CM

## 2021-03-12 MED ORDER — FLUPHENAZINE HCL 10 MG PO TABS
20 mg | Freq: Two times a day (BID) | ORAL | 0 refills | Status: CP
Start: 2021-03-12 — End: ?

## 2021-03-12 MED ORDER — TRAZODONE HCL 50 MG PO TABS
2 refills | Status: CP
Start: 2021-03-12 — End: ?

## 2021-03-12 MED ORDER — QUETIAPINE FUMARATE ER 300 MG PO TB24
600 mg | Freq: Every evening | ORAL | 2 refills | Status: CP
Start: 2021-03-12 — End: ?

## 2021-03-12 MED ORDER — DOCUSATE SODIUM 100 MG PO CAPS
100 mg | Freq: Every day | ORAL | 2 refills | 30.00 days | Status: CP | PRN
Start: 2021-03-12 — End: ?

## 2021-04-10 DIAGNOSIS — F251 Schizoaffective disorder, depressive type: Principal | ICD-10-CM

## 2021-04-10 MED ORDER — FLUPHENAZINE HCL 10 MG PO TABS
ORAL | 1 refills | Status: CP
Start: 2021-04-10 — End: ?

## 2021-04-18 DIAGNOSIS — F251 Schizoaffective disorder, depressive type: Principal | ICD-10-CM

## 2021-04-18 MED ORDER — IBUPROFEN 600 MG PO TABS
ORAL | 0 refills | 25.00000 days | Status: CP
Start: 2021-04-18 — End: ?

## 2021-04-18 MED ORDER — QUETIAPINE FUMARATE ER 300 MG PO TB24
600 mg | Freq: Every evening | ORAL | 2 refills | Status: CN
Start: 2021-04-18 — End: ?

## 2021-05-28 ENCOUNTER — Ambulatory Visit: Payer: Medicare Other | Attending: Psychiatry | Primary: Student in an Organized Health Care Education/Training Program

## 2021-05-28 DIAGNOSIS — K5903 Drug induced constipation: Secondary | ICD-10-CM

## 2021-05-28 DIAGNOSIS — F251 Schizoaffective disorder, depressive type: Principal | ICD-10-CM

## 2021-05-28 DIAGNOSIS — F259 Schizoaffective disorder, unspecified: Principal | ICD-10-CM

## 2021-05-28 DIAGNOSIS — G4700 Insomnia, unspecified: Secondary | ICD-10-CM

## 2021-05-28 MED ORDER — QUETIAPINE FUMARATE ER 300 MG PO TB24
600 mg | Freq: Every evening | ORAL | 0 refills | Status: CP
Start: 2021-05-28 — End: ?

## 2021-05-28 MED ORDER — TRAZODONE HCL 50 MG PO TABS
0 refills | Status: CP
Start: 2021-05-28 — End: ?

## 2021-06-09 DIAGNOSIS — F251 Schizoaffective disorder, depressive type: Principal | ICD-10-CM

## 2021-06-09 MED ORDER — FLUPHENAZINE HCL 10 MG PO TABS
ORAL | 2 refills | Status: CP
Start: 2021-06-09 — End: ?

## 2021-07-18 DIAGNOSIS — F251 Schizoaffective disorder, depressive type: Principal | ICD-10-CM

## 2021-07-18 MED ORDER — QUETIAPINE FUMARATE ER 300 MG PO TB24
600 mg | Freq: Every evening | ORAL | 0 refills | Status: CP
Start: 2021-07-18 — End: ?

## 2021-08-27 ENCOUNTER — Ambulatory Visit: Payer: Medicare Other | Attending: Psychiatry | Primary: Student in an Organized Health Care Education/Training Program

## 2021-08-27 DIAGNOSIS — F259 Schizoaffective disorder, unspecified: Principal | ICD-10-CM

## 2021-08-27 DIAGNOSIS — F251 Schizoaffective disorder, depressive type: Principal | ICD-10-CM

## 2021-08-27 DIAGNOSIS — K5903 Drug induced constipation: Secondary | ICD-10-CM

## 2021-08-27 DIAGNOSIS — G4709 Other insomnia: Secondary | ICD-10-CM

## 2021-08-27 MED ORDER — DOCUSATE SODIUM 100 MG PO CAPS
100 mg | Freq: Every day | ORAL | PRN
Start: 2021-08-27 — End: 2021-08-27

## 2021-08-27 MED ORDER — QUETIAPINE FUMARATE ER 400 MG PO TB24
400 mg | Freq: Every evening | ORAL | 2 refills | Status: CP
Start: 2021-08-27 — End: ?

## 2021-08-27 MED ORDER — TRAZODONE HCL 50 MG PO TABS
0 refills | Status: CP
Start: 2021-08-27 — End: ?

## 2021-08-27 MED ORDER — FLUPHENAZINE HCL 10 MG PO TABS
20 mg | Freq: Two times a day (BID) | ORAL | 2 refills | Status: CP
Start: 2021-08-27 — End: ?

## 2021-08-27 MED ORDER — DOCUSATE SODIUM 100 MG PO CAPS
100 mg | Freq: Every day | ORAL | 1 refills | 30.00000 days | Status: CP | PRN
Start: 2021-08-27 — End: ?

## 2021-11-19 ENCOUNTER — Ambulatory Visit: Payer: Medicare Other | Attending: Psychiatry

## 2021-11-19 DIAGNOSIS — Z5181 Encounter for therapeutic drug level monitoring: Secondary | ICD-10-CM

## 2021-11-19 DIAGNOSIS — Z0000 Encounter for general adult medical examination without abnormal findings: Secondary | ICD-10-CM

## 2021-11-19 DIAGNOSIS — K5903 Drug induced constipation: Secondary | ICD-10-CM

## 2021-11-19 DIAGNOSIS — F251 Schizoaffective disorder, depressive type: Principal | ICD-10-CM

## 2021-11-19 DIAGNOSIS — Z79899 Other long term (current) drug therapy: Secondary | ICD-10-CM

## 2021-11-19 MED ORDER — TRAZODONE HCL 50 MG PO TABS
0 refills | Status: CP
Start: 2021-11-19 — End: ?

## 2021-11-19 MED ORDER — DOCUSATE SODIUM 100 MG PO CAPS
100 mg | Freq: Every day | ORAL | 1 refills | 23.00 days | Status: CP | PRN
Start: 2021-11-19 — End: ?

## 2022-02-11 ENCOUNTER — Ambulatory Visit: Payer: Medicare Other | Attending: Psychiatry

## 2022-02-11 DIAGNOSIS — F99 Mental disorder, not otherwise specified: Secondary | ICD-10-CM

## 2022-02-11 DIAGNOSIS — K5903 Drug induced constipation: Secondary | ICD-10-CM

## 2022-02-11 DIAGNOSIS — F259 Schizoaffective disorder, unspecified: Principal | ICD-10-CM

## 2022-02-11 DIAGNOSIS — F251 Schizoaffective disorder, depressive type: Principal | ICD-10-CM

## 2022-02-11 DIAGNOSIS — F5105 Insomnia due to other mental disorder: Secondary | ICD-10-CM

## 2022-02-11 MED ORDER — TRAZODONE HCL 50 MG PO TABS
0 refills | Status: CP
Start: 2022-02-11 — End: ?

## 2022-02-11 MED ORDER — DOCUSATE SODIUM 100 MG PO CAPS
100 mg | Freq: Every day | ORAL | 2 refills | Status: CP | PRN
Start: 2022-02-11 — End: ?

## 2022-02-11 MED ORDER — QUETIAPINE FUMARATE ER 400 MG PO TB24
400 mg | Freq: Every evening | ORAL | 0 refills | Status: CP
Start: 2022-02-11 — End: ?

## 2022-02-11 MED ORDER — FLUPHENAZINE HCL 10 MG PO TABS
10 mg | Freq: Every day | ORAL | 0 refills | Status: CP
Start: 2022-02-11 — End: ?

## 2022-04-29 ENCOUNTER — Ambulatory Visit: Payer: Medicare Other | Attending: Psychiatry

## 2022-04-29 DIAGNOSIS — K5903 Drug induced constipation: Secondary | ICD-10-CM

## 2022-04-29 DIAGNOSIS — G4700 Insomnia, unspecified: Secondary | ICD-10-CM

## 2022-04-29 DIAGNOSIS — F259 Schizoaffective disorder, unspecified: Principal | ICD-10-CM

## 2022-04-29 DIAGNOSIS — F251 Schizoaffective disorder, depressive type: Principal | ICD-10-CM

## 2022-04-29 MED ORDER — QUETIAPINE FUMARATE ER 400 MG PO TB24
400 mg | Freq: Every evening | ORAL | 0 refills | Status: CP
Start: 2022-04-29 — End: ?

## 2022-04-29 MED ORDER — TRAZODONE HCL 50 MG PO TABS
0 refills | Status: CP
Start: 2022-04-29 — End: ?

## 2022-04-29 MED ORDER — DOCUSATE SODIUM 100 MG PO CAPS
100 mg | Freq: Every day | ORAL | 2 refills | Status: CP | PRN
Start: 2022-04-29 — End: ?

## 2022-04-29 MED ORDER — FLUPHENAZINE HCL 10 MG PO TABS
10 mg | Freq: Every day | ORAL | 0 refills | Status: CP
Start: 2022-04-29 — End: ?

## 2022-07-15 ENCOUNTER — Ambulatory Visit: Payer: Medicare Other | Attending: Psychiatry

## 2022-07-15 DIAGNOSIS — F251 Schizoaffective disorder, depressive type: Principal | ICD-10-CM

## 2022-07-15 DIAGNOSIS — K5903 Drug induced constipation: Secondary | ICD-10-CM

## 2022-07-15 MED ORDER — TRAZODONE HCL 50 MG PO TABS
0 refills | Status: CP
Start: 2022-07-15 — End: ?

## 2022-07-15 MED ORDER — FLUPHENAZINE HCL 10 MG PO TABS
10 mg | Freq: Every day | ORAL | 0 refills | Status: CP
Start: 2022-07-15 — End: ?

## 2022-07-15 MED ORDER — QUETIAPINE FUMARATE ER 400 MG PO TB24
400 mg | Freq: Every evening | ORAL | 0 refills | Status: CP
Start: 2022-07-15 — End: ?

## 2022-07-15 MED ORDER — DOCUSATE SODIUM 100 MG PO CAPS
100 mg | Freq: Every day | ORAL | 2 refills | Status: CP | PRN
Start: 2022-07-15 — End: ?

## 2022-07-22 ENCOUNTER — Encounter: Payer: Medicare Other | Attending: Psychiatry

## 2022-07-29 ENCOUNTER — Encounter: Payer: Medicare Other | Attending: Psychiatry

## 2022-10-21 ENCOUNTER — Ambulatory Visit: Payer: Medicare Other | Attending: Psychiatry

## 2022-10-21 DIAGNOSIS — F251 Schizoaffective disorder, depressive type: Principal | ICD-10-CM

## 2022-10-21 DIAGNOSIS — Z5181 Encounter for therapeutic drug level monitoring: Secondary | ICD-10-CM

## 2022-10-21 DIAGNOSIS — G4700 Insomnia, unspecified: Secondary | ICD-10-CM

## 2022-10-21 DIAGNOSIS — K5903 Drug induced constipation: Secondary | ICD-10-CM

## 2022-10-21 DIAGNOSIS — Z79899 Other long term (current) drug therapy: Secondary | ICD-10-CM

## 2022-10-21 DIAGNOSIS — F259 Schizoaffective disorder, unspecified: Principal | ICD-10-CM

## 2022-10-21 MED ORDER — TRAZODONE HCL 50 MG PO TABS
0 refills | Status: CP
Start: 2022-10-21 — End: ?

## 2022-10-21 MED ORDER — QUETIAPINE FUMARATE ER 400 MG PO TB24
400 mg | Freq: Every evening | ORAL | 0 refills | Status: CP
Start: 2022-10-21 — End: ?

## 2022-10-21 MED ORDER — DOCUSATE SODIUM 100 MG PO CAPS
100 mg | Freq: Every day | ORAL | 2 refills | 30.00000 days | Status: CP | PRN
Start: 2022-10-21 — End: ?

## 2022-10-21 MED ORDER — FLUPHENAZINE HCL 10 MG PO TABS
10 mg | Freq: Every day | ORAL | 0 refills | Status: CP
Start: 2022-10-21 — End: ?

## 2022-10-22 DIAGNOSIS — F259 Schizoaffective disorder, unspecified: Principal | ICD-10-CM

## 2023-01-13 ENCOUNTER — Ambulatory Visit: Payer: Medicare Other | Attending: Psychiatry

## 2023-01-13 DIAGNOSIS — K5903 Drug induced constipation: Secondary | ICD-10-CM

## 2023-01-13 DIAGNOSIS — F251 Schizoaffective disorder, depressive type: Principal | ICD-10-CM

## 2023-01-13 MED ORDER — FLUPHENAZINE HCL 10 MG PO TABS
10 mg | Freq: Every day | ORAL | 0 refills | Status: CP
Start: 2023-01-13 — End: ?

## 2023-01-13 MED ORDER — TRAZODONE HCL 50 MG PO TABS
0 refills | Status: CP
Start: 2023-01-13 — End: ?

## 2023-01-13 MED ORDER — DOCUSATE SODIUM 100 MG PO CAPS
100 mg | Freq: Every day | ORAL | 2 refills | 15.00 days | Status: CP | PRN
Start: 2023-01-13 — End: ?

## 2023-01-13 MED ORDER — QUETIAPINE FUMARATE ER 400 MG PO TB24
400 mg | Freq: Every evening | ORAL | 0 refills | Status: CP
Start: 2023-01-13 — End: ?

## 2023-04-12 DIAGNOSIS — F251 Schizoaffective disorder, depressive type: Principal | ICD-10-CM

## 2023-04-13 MED ORDER — FLUPHENAZINE HCL 10 MG PO TABS
10 mg | Freq: Every day | ORAL | 0 refills | Status: CP
Start: 2023-04-13 — End: ?

## 2023-04-13 MED ORDER — TRAZODONE HCL 50 MG PO TABS
0 refills | Status: CP
Start: 2023-04-13 — End: ?

## 2023-04-21 ENCOUNTER — Ambulatory Visit: Attending: Psychiatry

## 2023-04-21 DIAGNOSIS — K5903 Drug induced constipation: Secondary | ICD-10-CM

## 2023-04-21 DIAGNOSIS — F251 Schizoaffective disorder, depressive type: Principal | ICD-10-CM

## 2023-04-21 MED ORDER — TRAZODONE HCL 50 MG PO TABS
2 refills | Status: CP
Start: 2023-04-21 — End: ?

## 2023-04-21 MED ORDER — QUETIAPINE FUMARATE ER 400 MG PO TB24
400 mg | Freq: Every evening | ORAL | 2 refills | 30.00000 days | Status: CP
Start: 2023-04-21 — End: ?

## 2023-04-21 MED ORDER — DOCUSATE SODIUM 100 MG PO CAPS
100 mg | Freq: Every day | ORAL | 2 refills | 30.00000 days | Status: CP | PRN
Start: 2023-04-21 — End: ?

## 2023-04-21 MED ORDER — FLUPHENAZINE HCL 10 MG PO TABS
10 mg | Freq: Every day | ORAL | 2 refills | Status: CP
Start: 2023-04-21 — End: ?

## 2023-04-22 DIAGNOSIS — F259 Schizoaffective disorder, unspecified: Principal | ICD-10-CM

## 2023-07-19 DIAGNOSIS — F251 Schizoaffective disorder, depressive type: Principal | ICD-10-CM

## 2023-07-19 MED ORDER — QUETIAPINE FUMARATE ER 400 MG PO TB24
400 mg | Freq: Every evening | ORAL | 2 refills | Status: CP
Start: 2023-07-19 — End: ?

## 2023-07-21 ENCOUNTER — Ambulatory Visit: Attending: Psychiatry

## 2023-07-21 DIAGNOSIS — F251 Schizoaffective disorder, depressive type: Principal | ICD-10-CM

## 2023-07-21 DIAGNOSIS — K5903 Drug induced constipation: Secondary | ICD-10-CM

## 2023-07-21 DIAGNOSIS — Z79899 Other long term (current) drug therapy: Secondary | ICD-10-CM

## 2023-07-21 DIAGNOSIS — Z5181 Encounter for therapeutic drug level monitoring: Secondary | ICD-10-CM

## 2023-07-21 DIAGNOSIS — F259 Schizoaffective disorder, unspecified: Principal | ICD-10-CM

## 2023-07-21 MED ORDER — FLUPHENAZINE HCL 10 MG PO TABS
10 mg | Freq: Every day | ORAL | 2 refills | Status: CP
Start: 2023-07-21 — End: ?

## 2023-07-21 MED ORDER — DOCUSATE SODIUM 100 MG PO CAPS
100 mg | Freq: Every day | ORAL | 0 refills | 90.00 days | Status: CP | PRN
Start: 2023-07-21 — End: ?

## 2023-07-21 MED ORDER — QUETIAPINE FUMARATE ER 400 MG PO TB24
400 mg | Freq: Every evening | ORAL | 2 refills | 30.00000 days | Status: CP
Start: 2023-07-21 — End: ?

## 2023-07-21 MED ORDER — TRAZODONE HCL 50 MG PO TABS
2 refills | Status: CP
Start: 2023-07-21 — End: ?

## 2023-10-20 DIAGNOSIS — F251 Schizoaffective disorder, depressive type: Principal | ICD-10-CM

## 2023-10-20 MED ORDER — TRAZODONE HCL 50 MG PO TABS
ORAL | 0 refills | 30.00000 days | Status: CP
Start: 2023-10-20 — End: ?

## 2023-10-27 ENCOUNTER — Ambulatory Visit: Attending: Psychiatry

## 2023-10-27 DIAGNOSIS — F251 Schizoaffective disorder, depressive type: Principal | ICD-10-CM

## 2023-10-27 DIAGNOSIS — F259 Schizoaffective disorder, unspecified: Principal | ICD-10-CM

## 2023-10-27 DIAGNOSIS — K5903 Drug induced constipation: Secondary | ICD-10-CM

## 2023-10-27 MED ORDER — QUETIAPINE FUMARATE ER 400 MG PO TB24
400 mg | Freq: Every evening | ORAL | 0 refills | 30.00000 days | Status: CP
Start: 2023-10-27 — End: ?

## 2023-10-27 MED ORDER — DOCUSATE SODIUM 100 MG PO CAPS
100 mg | Freq: Every day | ORAL | 0 refills | 90.00000 days | Status: CP | PRN
Start: 2023-10-27 — End: ?

## 2023-10-27 MED ORDER — FLUPHENAZINE HCL 10 MG PO TABS
10 mg | Freq: Every day | ORAL | 0 refills | Status: CP
Start: 2023-10-27 — End: ?

## 2023-10-27 MED ORDER — TRAZODONE HCL 50 MG PO TABS
ORAL | 0 refills | 30.00000 days | Status: CP
Start: 2023-10-27 — End: ?

## 2023-11-05 DIAGNOSIS — K5903 Drug induced constipation: Principal | ICD-10-CM

## 2023-11-05 MED ORDER — SENNOSIDES-DOCUSATE SODIUM 8.6-50 MG PO TABS
1 | ORAL_TABLET | Freq: Every evening | ORAL | 2 refills | Status: CP | PRN
Start: 2023-11-05 — End: ?

## 2023-12-23 DIAGNOSIS — F251 Schizoaffective disorder, depressive type: Principal | ICD-10-CM

## 2023-12-23 MED ORDER — QUETIAPINE FUMARATE ER 400 MG PO TB24
400 mg | Freq: Every evening | ORAL | 0 refills | 30.00000 days | Status: CP
Start: 2023-12-23 — End: ?

## 2023-12-23 MED ORDER — FLUPHENAZINE HCL 10 MG PO TABS
10 mg | Freq: Every day | ORAL | 0 refills | Status: CP
Start: 2023-12-23 — End: ?

## 2023-12-23 MED ORDER — TRAZODONE HCL 50 MG PO TABS
ORAL | 0 refills | 90.00000 days | Status: CP
Start: 2023-12-23 — End: ?

## 2024-02-09 ENCOUNTER — Encounter: Attending: Psychiatry

## 2024-02-16 ENCOUNTER — Ambulatory Visit: Attending: Psychiatry

## 2024-02-16 DIAGNOSIS — Z5181 Encounter for therapeutic drug level monitoring: Secondary | ICD-10-CM

## 2024-02-16 DIAGNOSIS — F259 Schizoaffective disorder, unspecified: Principal | ICD-10-CM

## 2024-02-16 DIAGNOSIS — F251 Schizoaffective disorder, depressive type: Principal | ICD-10-CM

## 2024-02-16 DIAGNOSIS — Z79899 Other long term (current) drug therapy: Secondary | ICD-10-CM

## 2024-02-16 DIAGNOSIS — G4700 Insomnia, unspecified: Secondary | ICD-10-CM

## 2024-02-16 MED ORDER — FLUPHENAZINE HCL 10 MG PO TABS
10 mg | Freq: Every day | ORAL | 0 refills | Status: CP
Start: 2024-02-16 — End: ?

## 2024-02-16 MED ORDER — QUETIAPINE FUMARATE ER 400 MG PO TB24
400 mg | Freq: Every evening | ORAL | 0 refills | 30.00000 days | Status: CP
Start: 2024-02-16 — End: ?

## 2024-02-16 MED ORDER — TRAZODONE HCL 50 MG PO TABS
ORAL | 0 refills | 30.00000 days | Status: CP
Start: 2024-02-16 — End: ?

## 2024-05-08 DIAGNOSIS — F251 Schizoaffective disorder, depressive type: Principal | ICD-10-CM

## 2024-05-09 MED ORDER — QUETIAPINE FUMARATE ER 400 MG PO TB24
400 mg | Freq: Every evening | ORAL | 0 refills | 30.00000 days | Status: CP
Start: 2024-05-09 — End: ?

## 2024-05-12 ENCOUNTER — Telehealth: Payer: Self-pay

## 2024-05-12 NOTE — Telephone Encounter (Signed)
 Copied from CRM #[id number]. Topic: Appointments - Scheduling Inquiry for Clinic >> May 12, 2024  8:38 AM Vena HERO wrote: Reason for CRM: Pt sister, Valor Quaintance (01/11/1962) is a current pt of Dr Geofm and called in today to help her brother find a new provider since he is moving back to [facility]. She will be his caretaker due to some medical conditions and would like to have Dr Geofm accept him as a new pt to keep their family care in the same practice. Please consider this taking this pt as new and call sister at [phone] to advise and schedule a new pt appt.

## 2024-05-12 NOTE — Telephone Encounter (Signed)
I will accept

## 2024-05-17 ENCOUNTER — Encounter: Attending: Psychiatry

## 2024-05-28 DIAGNOSIS — F251 Schizoaffective disorder, depressive type: Principal | ICD-10-CM

## 2024-05-30 DIAGNOSIS — F251 Schizoaffective disorder, depressive type: Principal | ICD-10-CM

## 2024-05-30 MED ORDER — TRAZODONE HCL 50 MG PO TABS
0 refills | Status: CP
Start: 2024-05-30 — End: ?

## 2024-05-30 MED ORDER — TRAZODONE HCL 50 MG PO TABS
0 refills | Status: CN
Start: 2024-05-30 — End: ?

## 2024-06-19 ENCOUNTER — Ambulatory Visit: Admitting: Internal Medicine
